# Patient Record
Sex: Male | Born: 1960 | Race: Black or African American | Hispanic: No | Marital: Single | Smoking: Never smoker
Health system: Southern US, Community
[De-identification: ages and names within clinical notes are randomized; demographics above are authoritative.]

## PROBLEM LIST (undated history)

## (undated) DIAGNOSIS — I10 Essential (primary) hypertension: Secondary | ICD-10-CM

---

## 2013-05-20 DIAGNOSIS — F329 Major depressive disorder, single episode, unspecified: Secondary | ICD-10-CM | POA: Diagnosis not present

## 2013-05-20 DIAGNOSIS — G8929 Other chronic pain: Secondary | ICD-10-CM | POA: Diagnosis not present

## 2013-05-20 DIAGNOSIS — F17200 Nicotine dependence, unspecified, uncomplicated: Secondary | ICD-10-CM | POA: Diagnosis not present

## 2013-05-20 DIAGNOSIS — I10 Essential (primary) hypertension: Secondary | ICD-10-CM | POA: Diagnosis not present

## 2013-05-20 DIAGNOSIS — F3289 Other specified depressive episodes: Secondary | ICD-10-CM | POA: Diagnosis not present

## 2013-05-20 DIAGNOSIS — M542 Cervicalgia: Secondary | ICD-10-CM | POA: Diagnosis not present

## 2013-05-20 DIAGNOSIS — Z0000 Encounter for general adult medical examination without abnormal findings: Secondary | ICD-10-CM | POA: Diagnosis not present

## 2013-05-20 DIAGNOSIS — IMO0001 Reserved for inherently not codable concepts without codable children: Secondary | ICD-10-CM | POA: Diagnosis not present

## 2013-05-20 DIAGNOSIS — R7989 Other specified abnormal findings of blood chemistry: Secondary | ICD-10-CM | POA: Diagnosis not present

## 2015-04-20 DIAGNOSIS — M542 Cervicalgia: Secondary | ICD-10-CM | POA: Diagnosis not present

## 2015-04-20 DIAGNOSIS — M5032 Other cervical disc degeneration, mid-cervical region, unspecified level: Secondary | ICD-10-CM | POA: Diagnosis not present

## 2015-04-20 DIAGNOSIS — M9901 Segmental and somatic dysfunction of cervical region: Secondary | ICD-10-CM | POA: Diagnosis not present

## 2015-04-20 DIAGNOSIS — M545 Low back pain: Secondary | ICD-10-CM | POA: Diagnosis not present

## 2015-05-08 DIAGNOSIS — R351 Nocturia: Secondary | ICD-10-CM | POA: Diagnosis not present

## 2015-05-08 DIAGNOSIS — N401 Enlarged prostate with lower urinary tract symptoms: Secondary | ICD-10-CM | POA: Diagnosis not present

## 2015-05-19 DIAGNOSIS — D122 Benign neoplasm of ascending colon: Secondary | ICD-10-CM | POA: Diagnosis not present

## 2015-05-19 DIAGNOSIS — D123 Benign neoplasm of transverse colon: Secondary | ICD-10-CM | POA: Diagnosis not present

## 2015-05-19 DIAGNOSIS — D120 Benign neoplasm of cecum: Secondary | ICD-10-CM | POA: Diagnosis not present

## 2015-05-19 DIAGNOSIS — Z1211 Encounter for screening for malignant neoplasm of colon: Secondary | ICD-10-CM | POA: Diagnosis not present

## 2015-06-03 DIAGNOSIS — I10 Essential (primary) hypertension: Secondary | ICD-10-CM | POA: Diagnosis not present

## 2015-06-03 DIAGNOSIS — G8929 Other chronic pain: Secondary | ICD-10-CM | POA: Diagnosis not present

## 2015-06-03 DIAGNOSIS — M542 Cervicalgia: Secondary | ICD-10-CM | POA: Diagnosis not present

## 2015-06-03 DIAGNOSIS — R7303 Prediabetes: Secondary | ICD-10-CM | POA: Diagnosis not present

## 2015-06-03 DIAGNOSIS — F99 Mental disorder, not otherwise specified: Secondary | ICD-10-CM | POA: Diagnosis not present

## 2015-06-03 DIAGNOSIS — R5383 Other fatigue: Secondary | ICD-10-CM | POA: Diagnosis not present

## 2015-06-03 DIAGNOSIS — Z8639 Personal history of other endocrine, nutritional and metabolic disease: Secondary | ICD-10-CM | POA: Diagnosis not present

## 2015-06-03 DIAGNOSIS — Z0000 Encounter for general adult medical examination without abnormal findings: Secondary | ICD-10-CM | POA: Diagnosis not present

## 2015-06-03 DIAGNOSIS — Z79899 Other long term (current) drug therapy: Secondary | ICD-10-CM | POA: Diagnosis not present

## 2015-07-04 ENCOUNTER — Inpatient Hospital Stay (HOSPITAL_BASED_OUTPATIENT_CLINIC_OR_DEPARTMENT_OTHER)
Admission: EM | Admit: 2015-07-04 | Discharge: 2015-07-09 | DRG: 689 | Disposition: A | Discharge status: Home / Self Care | Payer: Medicare Other | Attending: Internal Medicine | Admitting: Internal Medicine

## 2015-07-04 ENCOUNTER — Emergency Department (HOSPITAL_BASED_OUTPATIENT_CLINIC_OR_DEPARTMENT_OTHER): Payer: Medicare Other

## 2015-07-04 ENCOUNTER — Encounter (HOSPITAL_BASED_OUTPATIENT_CLINIC_OR_DEPARTMENT_OTHER): Payer: Self-pay | Admitting: Emergency Medicine

## 2015-07-04 DIAGNOSIS — R112 Nausea with vomiting, unspecified: Secondary | ICD-10-CM | POA: Diagnosis not present

## 2015-07-04 DIAGNOSIS — K7200 Acute and subacute hepatic failure without coma: Secondary | ICD-10-CM | POA: Diagnosis not present

## 2015-07-04 DIAGNOSIS — N280 Ischemia and infarction of kidney: Secondary | ICD-10-CM | POA: Diagnosis not present

## 2015-07-04 DIAGNOSIS — E861 Hypovolemia: Secondary | ICD-10-CM | POA: Diagnosis present

## 2015-07-04 DIAGNOSIS — N12 Tubulo-interstitial nephritis, not specified as acute or chronic: Secondary | ICD-10-CM | POA: Diagnosis present

## 2015-07-04 DIAGNOSIS — I130 Hypertensive heart and chronic kidney disease with heart failure and stage 1 through stage 4 chronic kidney disease, or unspecified chronic kidney disease: Secondary | ICD-10-CM | POA: Diagnosis present

## 2015-07-04 DIAGNOSIS — I10 Essential (primary) hypertension: Secondary | ICD-10-CM | POA: Diagnosis present

## 2015-07-04 DIAGNOSIS — I5032 Chronic diastolic (congestive) heart failure: Secondary | ICD-10-CM | POA: Diagnosis present

## 2015-07-04 DIAGNOSIS — R1031 Right lower quadrant pain: Secondary | ICD-10-CM | POA: Diagnosis not present

## 2015-07-04 DIAGNOSIS — N179 Acute kidney failure, unspecified: Secondary | ICD-10-CM | POA: Diagnosis not present

## 2015-07-04 DIAGNOSIS — R7401 Elevation of levels of liver transaminase levels: Secondary | ICD-10-CM | POA: Diagnosis present

## 2015-07-04 DIAGNOSIS — R109 Unspecified abdominal pain: Secondary | ICD-10-CM | POA: Diagnosis not present

## 2015-07-04 DIAGNOSIS — N10 Acute tubulo-interstitial nephritis: Secondary | ICD-10-CM | POA: Diagnosis not present

## 2015-07-04 DIAGNOSIS — R740 Nonspecific elevation of levels of transaminase and lactic acid dehydrogenase [LDH]: Secondary | ICD-10-CM

## 2015-07-04 DIAGNOSIS — R1032 Left lower quadrant pain: Secondary | ICD-10-CM | POA: Diagnosis not present

## 2015-07-04 DIAGNOSIS — D72829 Elevated white blood cell count, unspecified: Secondary | ICD-10-CM | POA: Diagnosis present

## 2015-07-04 DIAGNOSIS — E860 Dehydration: Secondary | ICD-10-CM | POA: Diagnosis not present

## 2015-07-04 DIAGNOSIS — N182 Chronic kidney disease, stage 2 (mild): Secondary | ICD-10-CM | POA: Diagnosis present

## 2015-07-04 HISTORY — DX: Essential (primary) hypertension: I10

## 2015-07-04 LAB — I-STAT CG4 LACTIC ACID, ED: LACTIC ACID, VENOUS: 1.89 mmol/L (ref 0.5–2.0)

## 2015-07-04 MED ORDER — ONDANSETRON HCL 4 MG/2ML IJ SOLN
4.0000 mg | Freq: Once | INTRAMUSCULAR | Status: AC
  Administered 2015-07-04: 4 mg via INTRAVENOUS
  Filled 2015-07-04: qty 2

## 2015-07-04 MED ORDER — SODIUM CHLORIDE 0.9 % IV BOLUS (SEPSIS)
1000.0000 mL | Freq: Once | INTRAVENOUS | Status: AC
  Administered 2015-07-04: 1000 mL via INTRAVENOUS

## 2015-07-04 NOTE — ED Provider Notes (Signed)
CSN: [id number]     Arrival date & time 07/04/15  2109 History  By signing my name below, I, Altamease Oiler, attest that this documentation has been prepared under the direction and in the presence of Harvel Quale, MD. Electronically Signed: Altamease Oiler, ED Scribe. 07/05/2015. 12:48 AM    Chief Complaint  Patient presents with  . Generalized Body Aches  . Emesis    The history is provided by the patient. No language interpreter was used.   Brad Robinson is a 55 y.o. male with history of HTN who presents to the Emergency Department complaining of cramping, 7/10 in severity, lower abdominal pain with onset 3 days ago after eating chicken from [facility] "that didn't taste too well". Associated symptoms include chills, sweating, nausea, vomiting, and myalgias. Pt denies diarrhea. No history of diverticulitis. His lat colonoscopy was on the fall of last year. NKDA.   Past Medical History  Diagnosis Date  . Hypertension    History reviewed. No pertinent past surgical history. History reviewed. No pertinent family history. Social History  Substance Use Topics  . Smoking status: Never Smoker   . Smokeless tobacco: None  . Alcohol Use: Yes    Review of Systems  Constitutional: Positive for chills and diaphoresis.  Gastrointestinal: Positive for nausea, vomiting and abdominal pain. Negative for diarrhea.  Musculoskeletal: Positive for myalgias.  All other systems reviewed and are negative.     Allergies  Review of patient's allergies indicates no known allergies.  Home Medications   Prior to Admission medications   Not on File   BP 132/102 mmHg  Pulse 80  Temp(Src) 98.3 F (36.8 C) (Oral)  Resp 16  Ht 5\' 8"  (1.727 m)  Wt 200 lb (90.719 kg)  BMI 30.42 kg/m2  SpO2 100% Physical Exam  Constitutional: He is oriented to person, place, and time. He appears well-developed and well-nourished.  HENT:  Head: Normocephalic and atraumatic.  Mouth/Throat: Mucous membranes  are dry.  Eyes: EOM are normal.  Neck: Normal range of motion.  Cardiovascular: Normal rate, regular rhythm, normal heart sounds and intact distal pulses.   Pulmonary/Chest: Effort normal and breath sounds normal. No respiratory distress.  Abdominal: Soft. He exhibits no distension and no mass. There is tenderness.  Diffuse mild tenderness that is most significant in RLQ and LLQ   Musculoskeletal: Normal range of motion.  Neurological: He is alert and oriented to person, place, and time.  Skin: Skin is warm and dry.  Psychiatric: He has a normal mood and affect. Judgment normal.  Nursing note and vitals reviewed.   ED Course  Procedures (including critical care time) DIAGNOSTIC STUDIES: Oxygen Saturation is 100% on RA,  normal by my interpretation.    COORDINATION OF CARE: 10:46 PM Discussed treatment plan which includes lab work, CT A/P with contrast, and IVF with pt at bedside and pt agreed to plan.  Labs Review Labs Reviewed  CBC WITH DIFFERENTIAL/PLATELET - Abnormal; Notable for the following:    WBC 15.4 (*)    Neutro Abs 12.3 (*)    Monocytes Absolute 1.4 (*)    All other components within normal limits  COMPREHENSIVE METABOLIC PANEL - Abnormal; Notable for the following:    Sodium 132 (*)    Chloride 98 (*)    Glucose, Bld 115 (*)    Creatinine, Ser 1.63 (*)    Calcium 8.7 (*)    AST 88 (*)    ALT 114 (*)    GFR calc non Af Wyvonnia Lora  47 (*)    GFR calc Af Amer 54 (*)    All other components within normal limits  LIPASE, BLOOD  URINALYSIS, ROUTINE W REFLEX MICROSCOPIC (NOT AT [facility])  I-STAT CG4 LACTIC ACID, ED    Imaging Review No results found. I have personally reviewed and evaluated these images and lab results as part of my medical decision-making.   EKG Interpretation None      MDM  Patient seen and evaluated in stable condition. Patient obviously uncomfortable. Labs revealed a leukocytosis as well as a mildly elevated creatinine. Patient was given 2 IV  fluid boluses and Zofran with great improvement in his symptoms. He reported feeling well enough for discharge if CT unremarkable.  Case signed out to night team pending CT results. Final diagnoses:  None    1. Acute vomiting  2. Abdominal pain  I personally performed the services described in this documentation, which was scribed in my presence. The recorded information has been reviewed and is accurate.   Harvel Quale, MD 07/05/15 [phone]

## 2015-07-04 NOTE — ED Notes (Signed)
Pt in c/o abd pain, back pain, muscle spasms, diaphoresis, n/v onset 2 days ago. Thinks he has food poisoning, unknown if fever PTA.

## 2015-07-05 ENCOUNTER — Encounter (HOSPITAL_COMMUNITY): Payer: Self-pay | Admitting: *Deleted

## 2015-07-05 DIAGNOSIS — K7200 Acute and subacute hepatic failure without coma: Secondary | ICD-10-CM | POA: Diagnosis present

## 2015-07-05 DIAGNOSIS — N12 Tubulo-interstitial nephritis, not specified as acute or chronic: Secondary | ICD-10-CM | POA: Diagnosis not present

## 2015-07-05 DIAGNOSIS — R7401 Elevation of levels of liver transaminase levels: Secondary | ICD-10-CM | POA: Diagnosis present

## 2015-07-05 DIAGNOSIS — I10 Essential (primary) hypertension: Secondary | ICD-10-CM | POA: Diagnosis present

## 2015-07-05 DIAGNOSIS — E860 Dehydration: Secondary | ICD-10-CM | POA: Diagnosis present

## 2015-07-05 DIAGNOSIS — N280 Ischemia and infarction of kidney: Secondary | ICD-10-CM

## 2015-07-05 DIAGNOSIS — R109 Unspecified abdominal pain: Secondary | ICD-10-CM | POA: Diagnosis not present

## 2015-07-05 DIAGNOSIS — N10 Acute tubulo-interstitial nephritis: Secondary | ICD-10-CM | POA: Diagnosis present

## 2015-07-05 DIAGNOSIS — N179 Acute kidney failure, unspecified: Secondary | ICD-10-CM | POA: Diagnosis not present

## 2015-07-05 DIAGNOSIS — R740 Nonspecific elevation of levels of transaminase and lactic acid dehydrogenase [LDH]: Secondary | ICD-10-CM

## 2015-07-05 DIAGNOSIS — I5032 Chronic diastolic (congestive) heart failure: Secondary | ICD-10-CM | POA: Diagnosis present

## 2015-07-05 DIAGNOSIS — D72829 Elevated white blood cell count, unspecified: Secondary | ICD-10-CM | POA: Diagnosis not present

## 2015-07-05 DIAGNOSIS — N182 Chronic kidney disease, stage 2 (mild): Secondary | ICD-10-CM | POA: Diagnosis present

## 2015-07-05 DIAGNOSIS — E861 Hypovolemia: Secondary | ICD-10-CM | POA: Diagnosis present

## 2015-07-05 DIAGNOSIS — R935 Abnormal findings on diagnostic imaging of other abdominal regions, including retroperitoneum: Secondary | ICD-10-CM | POA: Diagnosis not present

## 2015-07-05 DIAGNOSIS — I130 Hypertensive heart and chronic kidney disease with heart failure and stage 1 through stage 4 chronic kidney disease, or unspecified chronic kidney disease: Secondary | ICD-10-CM | POA: Diagnosis present

## 2015-07-05 LAB — COMPREHENSIVE METABOLIC PANEL
ALBUMIN: 3.8 g/dL (ref 3.5–5.0)
ALT: 114 U/L — AB (ref 17–63)
ANION GAP: 9 (ref 5–15)
AST: 88 U/L — AB (ref 15–41)
Alkaline Phosphatase: 78 U/L (ref 38–126)
BILIRUBIN TOTAL: 0.8 mg/dL (ref 0.3–1.2)
BUN: 19 mg/dL (ref 6–20)
CALCIUM: 8.7 mg/dL — AB (ref 8.9–10.3)
CHLORIDE: 98 mmol/L — AB (ref 101–111)
CO2: 25 mmol/L (ref 22–32)
Creatinine, Ser: 1.63 mg/dL — ABNORMAL HIGH (ref 0.61–1.24)
GFR calc Af Amer: 54 mL/min — ABNORMAL LOW (ref >60)
GFR, EST NON AFRICAN AMERICAN: 47 mL/min — AB (ref >60)
Glucose, Bld: 115 mg/dL — ABNORMAL HIGH (ref 65–99)
Potassium: 3.6 mmol/L (ref 3.5–5.1)
Sodium: 132 mmol/L — ABNORMAL LOW (ref 135–145)
Total Protein: 7.9 g/dL (ref 6.5–8.1)

## 2015-07-05 LAB — BASIC METABOLIC PANEL
ANION GAP: 13 (ref 5–15)
BUN: 12 mg/dL (ref 6–20)
CALCIUM: 8.5 mg/dL — AB (ref 8.9–10.3)
CO2: 25 mmol/L (ref 22–32)
Chloride: 96 mmol/L — ABNORMAL LOW (ref 101–111)
Creatinine, Ser: 1.62 mg/dL — ABNORMAL HIGH (ref 0.61–1.24)
GFR calc Af Amer: 54 mL/min — ABNORMAL LOW (ref >60)
GFR calc non Af Amer: 47 mL/min — ABNORMAL LOW (ref >60)
GLUCOSE: 110 mg/dL — AB (ref 65–99)
Potassium: 3.4 mmol/L — ABNORMAL LOW (ref 3.5–5.1)
Sodium: 134 mmol/L — ABNORMAL LOW (ref 135–145)

## 2015-07-05 LAB — URINALYSIS, ROUTINE W REFLEX MICROSCOPIC
BILIRUBIN URINE: NEGATIVE
Glucose, UA: NEGATIVE mg/dL
Hgb urine dipstick: NEGATIVE
Ketones, ur: 15 mg/dL — AB
Leukocytes, UA: NEGATIVE
NITRITE: NEGATIVE
PROTEIN: 30 mg/dL — AB
SPECIFIC GRAVITY, URINE: 1.024 (ref 1.005–1.030)
pH: 5.5 (ref 5.0–8.0)

## 2015-07-05 LAB — CBC
HEMATOCRIT: 40.3 % (ref 39.0–52.0)
Hemoglobin: 13.7 g/dL (ref 13.0–17.0)
MCH: 29.5 pg (ref 26.0–34.0)
MCHC: 34 g/dL (ref 30.0–36.0)
MCV: 86.7 fL (ref 78.0–100.0)
Platelets: 327 10*3/uL (ref 150–400)
RBC: 4.65 MIL/uL (ref 4.22–5.81)
RDW: 12.9 % (ref 11.5–15.5)
WBC: 13.4 10*3/uL — AB (ref 4.0–10.5)

## 2015-07-05 LAB — URINE MICROSCOPIC-ADD ON: Bacteria, UA: NONE SEEN

## 2015-07-05 LAB — RAPID URINE DRUG SCREEN, HOSP PERFORMED
AMPHETAMINES: NOT DETECTED
BARBITURATES: NOT DETECTED
BENZODIAZEPINES: NOT DETECTED
COCAINE: NOT DETECTED
Opiates: NOT DETECTED
TETRAHYDROCANNABINOL: POSITIVE — AB

## 2015-07-05 LAB — PROTIME-INR
INR: 1.22 (ref 0.00–1.49)
PROTHROMBIN TIME: 15.6 s — AB (ref 11.6–15.2)

## 2015-07-05 LAB — LACTIC ACID, PLASMA
Lactic Acid, Venous: 1 mmol/L (ref 0.5–2.0)
Lactic Acid, Venous: 1.4 mmol/L (ref 0.5–2.0)

## 2015-07-05 LAB — CBC WITH DIFFERENTIAL/PLATELET
BASOS ABS: 0.1 10*3/uL (ref 0.0–0.1)
BASOS PCT: 0 %
Eosinophils Absolute: 0 10*3/uL (ref 0.0–0.7)
Eosinophils Relative: 0 %
HEMATOCRIT: 41.4 % (ref 39.0–52.0)
HEMOGLOBIN: 14.4 g/dL (ref 13.0–17.0)
LYMPHS PCT: 11 %
Lymphs Abs: 1.7 10*3/uL (ref 0.7–4.0)
MCH: 30 pg (ref 26.0–34.0)
MCHC: 34.8 g/dL (ref 30.0–36.0)
MCV: 86.3 fL (ref 78.0–100.0)
MONO ABS: 1.4 10*3/uL — AB (ref 0.1–1.0)
Monocytes Relative: 9 %
NEUTROS ABS: 12.3 10*3/uL — AB (ref 1.7–7.7)
NEUTROS PCT: 80 %
Platelets: 323 10*3/uL (ref 150–400)
RBC: 4.8 MIL/uL (ref 4.22–5.81)
RDW: 12.7 % (ref 11.5–15.5)
WBC: 15.4 10*3/uL — AB (ref 4.0–10.5)

## 2015-07-05 LAB — MAGNESIUM: Magnesium: 2 mg/dL (ref 1.7–2.4)

## 2015-07-05 LAB — LIPASE, BLOOD: LIPASE: 29 U/L (ref 11–51)

## 2015-07-05 LAB — CREATININE, URINE, RANDOM: Creatinine, Urine: 69.32 mg/dL

## 2015-07-05 LAB — TSH: TSH: 1.222 u[IU]/mL (ref 0.350–4.500)

## 2015-07-05 LAB — SODIUM, URINE, RANDOM: Sodium, Ur: 108 mmol/L

## 2015-07-05 MED ORDER — DEXTROSE 5 % IV SOLN
2.0000 g | INTRAVENOUS | Status: DC
  Administered 2015-07-05 – 2015-07-08 (×4): 2 g via INTRAVENOUS
  Filled 2015-07-05 (×5): qty 2

## 2015-07-05 MED ORDER — ONDANSETRON HCL 4 MG PO TABS: 4.0000 mg | ORAL_TABLET | Freq: Three times a day (TID) | ORAL | Status: AC | PRN

## 2015-07-05 MED ORDER — METHOCARBAMOL 500 MG PO TABS
750.0000 mg | ORAL_TABLET | Freq: Four times a day (QID) | ORAL | Status: DC | PRN
  Administered 2015-07-06 – 2015-07-07 (×3): 750 mg via ORAL
  Filled 2015-07-05 (×3): qty 2

## 2015-07-05 MED ORDER — SODIUM CHLORIDE 0.9 % IV BOLUS (SEPSIS)
1000.0000 mL | Freq: Once | INTRAVENOUS | Status: AC
  Administered 2015-07-05: 1000 mL via INTRAVENOUS

## 2015-07-05 MED ORDER — WHITE PETROLATUM GEL
Status: AC
  Administered 2015-07-05: 0.2
  Filled 2015-07-05: qty 1

## 2015-07-05 MED ORDER — ENOXAPARIN SODIUM 40 MG/0.4ML ~~LOC~~ SOLN
40.0000 mg | SUBCUTANEOUS | Status: DC
  Administered 2015-07-05: 40 mg via SUBCUTANEOUS
  Filled 2015-07-05: qty 0.4

## 2015-07-05 MED ORDER — IOPAMIDOL (ISOVUE-300) INJECTION 61%
100.0000 mL | Freq: Once | INTRAVENOUS | Status: AC | PRN
  Administered 2015-07-05: 100 mL via INTRAVENOUS

## 2015-07-05 MED ORDER — SODIUM CHLORIDE 0.45 % IV BOLUS: 1000.0000 mL | Freq: Once | INTRAVENOUS | Status: DC

## 2015-07-05 MED ORDER — ALBUTEROL SULFATE (2.5 MG/3ML) 0.083% IN NEBU: 2.5000 mg | INHALATION_SOLUTION | RESPIRATORY_TRACT | Status: DC | PRN

## 2015-07-05 MED ORDER — CEFTRIAXONE SODIUM 1 G IJ SOLR
1.0000 g | Freq: Once | INTRAMUSCULAR | Status: AC
  Administered 2015-07-05: 1 g via INTRAVENOUS
  Filled 2015-07-05: qty 10

## 2015-07-05 MED ORDER — HYDRALAZINE HCL 20 MG/ML IJ SOLN: 10.0000 mg | Freq: Four times a day (QID) | INTRAMUSCULAR | Status: DC | PRN

## 2015-07-05 MED ORDER — MORPHINE SULFATE (PF) 2 MG/ML IV SOLN
2.0000 mg | INTRAVENOUS | Status: DC | PRN
Start: 2015-07-05 — End: 2015-07-09

## 2015-07-05 MED ORDER — SODIUM CHLORIDE 0.9 % IV SOLN
INTRAVENOUS | Status: DC
  Administered 2015-07-05 – 2015-07-08 (×8): via INTRAVENOUS

## 2015-07-05 MED ORDER — ONDANSETRON HCL 4 MG/2ML IJ SOLN
4.0000 mg | Freq: Once | INTRAMUSCULAR | Status: AC
  Administered 2015-07-05: 4 mg via INTRAVENOUS
  Filled 2015-07-05: qty 2

## 2015-07-05 MED ORDER — ACETAMINOPHEN 650 MG RE SUPP: 650.0000 mg | Freq: Four times a day (QID) | RECTAL | Status: DC | PRN

## 2015-07-05 MED ORDER — HYDROCODONE-ACETAMINOPHEN 5-325 MG PO TABS: 1.0000 | ORAL_TABLET | ORAL | Status: DC | PRN

## 2015-07-05 MED ORDER — FENTANYL CITRATE (PF) 100 MCG/2ML IJ SOLN
50.0000 ug | Freq: Once | INTRAMUSCULAR | Status: AC
  Administered 2015-07-05: 50 ug via INTRAVENOUS
  Filled 2015-07-05: qty 2

## 2015-07-05 MED ORDER — POTASSIUM CHLORIDE 10 MEQ/100ML IV SOLN
INTRAVENOUS | Status: AC
  Filled 2015-07-05: qty 100

## 2015-07-05 MED ORDER — ONDANSETRON HCL 4 MG/2ML IJ SOLN: 4.0000 mg | Freq: Four times a day (QID) | INTRAMUSCULAR | Status: DC | PRN

## 2015-07-05 MED ORDER — ONDANSETRON HCL 4 MG PO TABS
4.0000 mg | ORAL_TABLET | Freq: Four times a day (QID) | ORAL | Status: DC | PRN
  Administered 2015-07-06: 4 mg via ORAL
  Filled 2015-07-05: qty 1

## 2015-07-05 MED ORDER — POTASSIUM CHLORIDE 10 MEQ/100ML IV SOLN
10.0000 meq | INTRAVENOUS | Status: AC
  Administered 2015-07-05 (×4): 10 meq via INTRAVENOUS
  Filled 2015-07-05: qty 100

## 2015-07-05 MED ORDER — ACETAMINOPHEN 325 MG PO TABS: 650.0000 mg | ORAL_TABLET | Freq: Four times a day (QID) | ORAL | Status: DC | PRN

## 2015-07-05 NOTE — ED Notes (Addendum)
Resting/ sleeping, NAD, calm, arousable to voice, "feel a little better".

## 2015-07-05 NOTE — Progress Notes (Signed)
Pharmacy Antibiotic Note  Ken Cambray is a 55 y.o. male admitted on 07/04/2015 with Pyelonehritis.  Pharmacy has been consulted for Ceftriaxone dosing. WBC elevated, Imaging with bilateral pyelo vs renal infarcts.   Plan: -Ceftriaxone 2g IV q24h -F/U urine culture for directed therapy  Height: 5\' 8"  (172.7 cm) Weight: 182 lb 5.1 oz (82.7 kg) IBW/kg (Calculated) : 68.4  Temp (24hrs), Avg:98.7 F (37.1 C), Min:98.3 F (36.8 C), Max:98.9 F (37.2 C)   Recent Labs Lab 07/04/15 2323 07/04/15 2359  WBC  --  15.4*  CREATININE  --  1.63*  LATICACIDVEN 1.89  --     Estimated Creatinine Clearance: 54.9 mL/min (by C-G formula based on Cr of 1.63).    No Known Allergies   Redrick, Nordahl 07/05/2015 7:01 AM

## 2015-07-05 NOTE — Progress Notes (Addendum)
55 yo M previously healthy presents with myalgias, flank pain, nausea/vomiting, sweats.    BP 146/94 mmHg  Pulse 74  Temp(Src) 98.3 F (36.8 C) (Oral)  Resp 16  Ht 5\' 8"  (1.727 m)  Wt 90.719 kg (200 lb)  BMI 30.42 kg/m2  SpO2 95%  UA without bacteria, pyuria.  But CT shows wedge shaped lesions on CT w/ contrast consistent with either pyelo or infarction.    Creatinine 1.6, baseline 1.1.  Will obtain urine lytes, blood cultures, urine culture, start ceftriaxone.  To med surg bed on fluids.

## 2015-07-05 NOTE — ED Notes (Signed)
Pt in CT.

## 2015-07-05 NOTE — H&P (Signed)
[facility] History and Physical  Brad Robinson [id number] DOB: 02-17-1961 DOA: 07/04/2015  Referring physician: [facility] ED PCP: No primary care provider on file.   Chief Complaint: Abdominal cramps/spasm and vomiting  HPI:  Brad Robinson is a 55 year old male with a past medical history significant for hypertension who presents complaining of abdominal cramping/spasms. Symptoms started 3 days ago after eating chicken from [place]. Thereafter reports having hot flashes, chills, sweats, nausea, vomiting, and whole-body muscle spasms. Reports being not able to keep any food or liquids down. Reports significant pains in the lower abdomen that radiates upward. Denies any chest pain, focal weakness, changes in vision, or rash. Patient never had symptoms like this previously in the past.   Upon Admission to the emergency department patient was evaluated with CT scan of abdomen which revealed multiple wedge-shaped lesions suggestive of pyelonephritis versus renal infarctions. Urine analysis was negative. Patient was started on IV fluids, Rocephin,  and transferred from [facility] [place] to a MedSurg bed for the hospitalist group to dependent.    Review of Systems  Constitutional: Positive for fever, chills, malaise/fatigue and diaphoresis.  HENT: Negative for ear pain and tinnitus.   Respiratory: Negative for hemoptysis and shortness of breath.   Cardiovascular: Negative for chest pain.  Gastrointestinal: Positive for nausea, vomiting and abdominal pain. Negative for diarrhea.  Genitourinary: Positive for flank pain. Negative for urgency.  Musculoskeletal: Positive for myalgias and back pain.  Skin: Negative for itching and rash.  Neurological: Negative for speech change and focal weakness.  Psychiatric/Behavioral: Negative for hallucinations and substance abuse.     Past Medical History  Diagnosis Date  . Hypertension      History reviewed. No pertinent past surgical  history.    Social History:  reports that he has never smoked. He does not have any smokeless tobacco history on file. He reports that he drinks alcohol. His drug history is not on file. Where does patient live--home Can patient participate in ADLs? Yes  No Known Allergies  History reviewed. No pertinent family history.    Prior to Admission medications   Medication Sig Start Date End Date Taking? Authorizing Provider  ondansetron (ZOFRAN) 4 MG tablet Take 1 tablet (4 mg total) by mouth every 8 (eight) hours as needed for nausea or vomiting. 07/05/15   Harvel Quale, MD     Physical Exam: Filed Vitals:   07/05/15 0330 07/05/15 0340 07/05/15 0345 07/05/15 0350  BP: 145/103 145/103 149/96   Pulse: 81 76 79   Temp:    98.8 F (37.1 C)  TempSrc:    Oral  Resp:  16    Height:      Weight:      SpO2: 97% 97% 97%     Constitutional: Vital signs reviewed. Patient is sick appearing , but nontoxic. Alert and oriented 3. Head: Normocephalic and atraumatic  Ear: TM normal bilaterally  Mouth: no erythema or exudates, MMM  Eyes: PERRL, EOMI, conjunctivae normal, No scleral icterus.  Neck: Supple, Trachea midline normal ROM, No JVD, mass, thyromegaly, or carotid bruit present.  Cardiovascular: RRR, S1 normal, S2 normal, no MRG, pulses symmetric and intact bilaterally  Pulmonary/Chest: CTAB, no wheezes, rales, or rhonchi  Abdominal: Soft. Diffusely tender in the lower quadrants, positive bowel sounds, no guarding noted [id number] bilateral CVA tenderness  Musculoskeletal: No joint deformities, erythema, or stiffness, ROM full and no nontender Ext: no edema and no cyanosis, pulses palpable bilaterally (DP and PT)  Hematology: no cervical, inginal, or  axillary adenopathy.  Neurological: A&O x3, Strenght is normal and symmetric bilaterally, cranial nerve II-XII are grossly intact, no focal motor deficit, sensory intact to light touch bilaterally.  Skin: Warm, dry and intact. No rash,  cyanosis, or clubbing.  Psychiatric: Normal mood and affect. speech and behavior is normal. Judgment and thought content normal. Cognition and memory are normal.      Data Review   Micro Results No results found for this or any previous visit (from the past 240 hour(s)).  Radiology Reports Ct Abdomen Pelvis W Contrast  07/05/2015  CLINICAL DATA:  Cramping abdominal pain, onset 3 days ago. EXAM: CT ABDOMEN AND PELVIS WITH CONTRAST TECHNIQUE: Multidetector CT imaging of the abdomen and pelvis was performed using the standard protocol following bolus administration of intravenous contrast. CONTRAST:  114mL ISOVUE-300 IOPAMIDOL (ISOVUE-300) INJECTION 61% COMPARISON:  None. FINDINGS: Lower chest:  No significant abnormality Hepatobiliary: There are normal appearances of the liver, gallbladder and bile ducts. Pancreas: Normal Spleen: Normal Adrenals/Urinary Tract: Both adrenals are normal. There are multiple wedge-shaped low-attenuation abnormalities in both kidneys, involving cortex and medulla. This may represent pyelonephritis. Renal infarctions could also produce this appearance. No discrete renal mass is evident. No obstructive uropathy. The ureters and urinary bladder are unremarkable. Stomach/Bowel: There are normal appearances of the stomach, small bowel and colon. The appendix is normal. Vascular/Lymphatic: The abdominal aorta is normal in caliber. There is mild atherosclerotic calcification. There is no adenopathy in the abdomen or pelvis. Reproductive: Unremarkable Other: No ascites. Musculoskeletal: No significant skeletal lesion. Moderately severe degenerative lumbar disc disease is present at L4 through the sacrum. IMPRESSION: Both kidneys are abnormal, with multiple wedge-shaped peripherally based areas of hypodensity. This may represent bilateral pyelonephritis. Renal infarctions may be considered as a less likely possibility. Electronically Signed   By: Andreas Newport M.D.   On: 07/05/2015  02:13     CBC  Recent Labs Lab 07/04/15 2359  WBC 15.4*  HGB 14.4  HCT 41.4  PLT 323  MCV 86.3  MCH 30.0  MCHC 34.8  RDW 12.7  LYMPHSABS 1.7  MONOABS 1.4*  EOSABS 0.0  BASOSABS 0.1    Chemistries   Recent Labs Lab 07/04/15 2359  NA 132*  K 3.6  CL 98*  CO2 25  GLUCOSE 115*  BUN 19  CREATININE 1.63*  CALCIUM 8.7*  AST 88*  ALT 114*  ALKPHOS 78  BILITOT 0.8   ------------------------------------------------------------------------------------------------------------------ estimated creatinine clearance is 57.3 mL/min (by C-G formula based on Cr of 1.63). ------------------------------------------------------------------------------------------------------------------ No results for input(s): HGBA1C in the last 72 hours. ------------------------------------------------------------------------------------------------------------------ No results for input(s): CHOL, HDL, LDLCALC, TRIG, CHOLHDL, LDLDIRECT in the last 72 hours. ------------------------------------------------------------------------------------------------------------------ No results for input(s): TSH, T4TOTAL, T3FREE, THYROIDAB in the last 72 hours.  Invalid input(s): FREET3 ------------------------------------------------------------------------------------------------------------------ No results for input(s): VITAMINB12, FOLATE, FERRITIN, TIBC, IRON, RETICCTPCT in the last 72 hours.  Coagulation profile No results for input(s): INR, PROTIME in the last 168 hours.  No results for input(s): DDIMER in the last 72 hours.  Cardiac Enzymes No results for input(s): CKMB, TROPONINI, MYOGLOBIN in the last 168 hours.  Invalid input(s): CK ------------------------------------------------------------------------------------------------------------------ Invalid input(s): POCBNP   CBG: No results for input(s): GLUCAP in the last 168 hours.        Assessment/Plan Question of Pyelonephritis  vs. Renal infarct:. CT reporting the possibility of bilateral pyelonephritis versus bilateral renal infarcts. UA appeared to be negative. - Admit to a MedSurg bed - Checking urine drug screen - Follow up blood cultures -  Placed on empiric antibiotics of Rocephin   Leukocytosis: WBC 15.4 - Checking lactic acid now  Acute kidney injury: Baseline creatinine somewhere around 1.1, but on admission elevated to 1.63. Cause unknown with suspected aspect of prerenal. - IVF of NS at 180ml/hr - Checking FeNa  Essential hypertension: Stable - Continue to monitor   Transaminitis: Secondary to dehydration - Continue to monitor    Lovenox for DVT prophylaxis  Code Status:   full Family Communication: bedside Disposition Plan: admit   Total time spent 55 minutes.Greater than 50% of this time was spent in counseling, explanation of diagnosis, planning of further management, and coordination of care  [facility] Pager [phone]  If 7PM-7AM, please contact night-coverage [email or web address] Password [email or web address] 07/05/2015, 6:01 AM

## 2015-07-05 NOTE — Progress Notes (Signed)
Patient seen and examined, does not look comfortable, though reports feeling better, no vomiting, back pain and abdominal better, pyelonephritis vs bilateral renal infarct, on abx, culture pending, check echo to r/o embolic source

## 2015-07-06 ENCOUNTER — Inpatient Hospital Stay (HOSPITAL_COMMUNITY): Payer: Medicare Other

## 2015-07-06 DIAGNOSIS — N280 Ischemia and infarction of kidney: Secondary | ICD-10-CM

## 2015-07-06 LAB — CBC
HCT: 36.7 % — ABNORMAL LOW (ref 39.0–52.0)
Hemoglobin: 12.4 g/dL — ABNORMAL LOW (ref 13.0–17.0)
MCH: 29.2 pg (ref 26.0–34.0)
MCHC: 33.8 g/dL (ref 30.0–36.0)
MCV: 86.4 fL (ref 78.0–100.0)
PLATELETS: 315 10*3/uL (ref 150–400)
RBC: 4.25 MIL/uL (ref 4.22–5.81)
RDW: 12.8 % (ref 11.5–15.5)
WBC: 14 10*3/uL — ABNORMAL HIGH (ref 4.0–10.5)

## 2015-07-06 LAB — HEPATITIS PANEL, ACUTE
HCV Ab: <0.1 s/co ratio (ref 0.0–0.9)
HEP B C IGM: NEGATIVE
HEP B S AG: NEGATIVE
Hep A IgM: NEGATIVE

## 2015-07-06 LAB — COMPREHENSIVE METABOLIC PANEL
ALT: 138 U/L — AB (ref 17–63)
AST: 85 U/L — AB (ref 15–41)
Albumin: 2.6 g/dL — ABNORMAL LOW (ref 3.5–5.0)
Alkaline Phosphatase: 80 U/L (ref 38–126)
Anion gap: 9 (ref 5–15)
BUN: 10 mg/dL (ref 6–20)
CHLORIDE: 100 mmol/L — AB (ref 101–111)
CO2: 25 mmol/L (ref 22–32)
CREATININE: 1.41 mg/dL — AB (ref 0.61–1.24)
Calcium: 8.3 mg/dL — ABNORMAL LOW (ref 8.9–10.3)
GFR calc non Af Amer: 55 mL/min — ABNORMAL LOW (ref >60)
Glucose, Bld: 98 mg/dL (ref 65–99)
Potassium: 3.8 mmol/L (ref 3.5–5.1)
SODIUM: 134 mmol/L — AB (ref 135–145)
Total Bilirubin: 0.9 mg/dL (ref 0.3–1.2)
Total Protein: 6.2 g/dL — ABNORMAL LOW (ref 6.5–8.1)

## 2015-07-06 LAB — HEPARIN LEVEL (UNFRACTIONATED): Heparin Unfractionated: 0.32 IU/mL (ref 0.30–0.70)

## 2015-07-06 LAB — ANTITHROMBIN III: AntiThromb III Func: 96 % (ref 75–120)

## 2015-07-06 LAB — LACTIC ACID, PLASMA: LACTIC ACID, VENOUS: 0.9 mmol/L (ref 0.5–2.0)

## 2015-07-06 MED ORDER — HEPARIN (PORCINE) IN NACL 100-0.45 UNIT/ML-% IJ SOLN
1600.0000 [IU]/h | INTRAMUSCULAR | Status: AC
  Administered 2015-07-06 (×2): 1150 [IU]/h via INTRAVENOUS
  Filled 2015-07-06 (×3): qty 250

## 2015-07-06 MED ORDER — HEPARIN BOLUS VIA INFUSION
4000.0000 [IU] | Freq: Once | INTRAVENOUS | Status: AC
  Administered 2015-07-06: 4000 [IU] via INTRAVENOUS
  Filled 2015-07-06: qty 4000

## 2015-07-06 NOTE — Progress Notes (Signed)
*  PRELIMINARY RESULTS* Vascular Ultrasound Renal Artery Duplex has been completed.  Preliminary findings: No evidence of renal artery stenosis.   Landry Mellow, RDMS, RVT  07/06/2015, 12:02 PM

## 2015-07-06 NOTE — Progress Notes (Signed)
[facility] PROGRESS NOTE  Brad Robinson [id number] DOB: May 16, 1960 DOA: 07/04/2015   PCP: No primary care provider on file.  Length of stay: 1   Assessment/Plan: Principal Problem:   Pyelonephritis Active Problems:   Renal infarct (HCC)   Leukocytosis   AKI (acute kidney injury) ([place])   Transaminitis   Benign essential HTN   Brief summary 55 year old male with a past medical history significant for hypertension who presents complaining of abdominal cramping/spasms. Symptoms started 3 days ago after eating chicken from [place]. Thereafter reports having hot flashes, chills, sweats, nausea, vomiting, and whole-body muscle spasms. Reports being not able to keep any food or liquids down. Reports significant pains in the lower abdomen that radiates upward. Denies any chest pain, focal weakness, changes in vision, or rash. Patient never had symptoms like this previously in the past.   Upon Admission to the emergency department patient was evaluated with CT scan of abdomen which revealed multiple wedge-shaped lesions suggestive of pyelonephritis versus renal infarctions. Urine analysis was negative. Patient was started on IV fluids, Rocephin, and transferred from [facility] to a MedSurg bed for the hospitalist group to dependent.  Assessment and plan  Question of Pyelonephritis vs. Renal infarct:. CT reporting the possibility of bilateral pyelonephritis versus bilateral renal infarcts. UA appeared to be negative. Place patient on tele to r/o afib  - Follow up blood cultures - Placed on empiric antibiotics of Rocephin  Echo to r/o low EF Hypercoagulable panel R/o Renal artery stenosis , renal artery doppler  Repeat CT scan in 48 hours, hold off on Coumadin for now pending repeat CT scan  Leukocytosis: WBC 15.4 - Checking lactic acid now  Acute kidney injury: Baseline creatinine somewhere around 1.1, but on admission elevated to 1.63>1.41. Cause unknown with  suspected aspect of prerenal. - IVF of NS at 164ml/hr    Essential hypertension: Stable - Continue to monitor  Transaminitis: Secondary to dehydration - Continue to monitor   DVT prophylaxsis heparin gtt   Code Status:      Code Status Orders        Start     Ordered   07/05/15 0557  Full code   Continuous     07/05/15 0559    Code Status History    Date Active Date Inactive Code Status Order ID Comments User Context   This patient has a current code status but no historical code status.      Family Communication: Discussed in detail with the patient, all imaging results, lab results explained to the patient   Disposition Plan: Repeat CT scan in 48 hours    Consultants:  None  Procedures:  None  Antibiotics: Anti-infectives    Start     Dose/Rate Route Frequency Ordered Stop   07/05/15 2200  cefTRIAXone (ROCEPHIN) 2 g in dextrose 5 % 50 mL IVPB     2 g 100 mL/hr over 30 Minutes Intravenous Every 24 hours 07/05/15 0700     07/05/15 0300  cefTRIAXone (ROCEPHIN) 1 g in dextrose 5 % 50 mL IVPB     1 g 100 mL/hr over 30 Minutes Intravenous  Once 07/05/15 0248 07/05/15 0419         HPI/Subjective: Patient had a low-grade fever last night  Objective: Filed Vitals:   07/05/15 1654 07/05/15 2100 07/06/15 0446 07/06/15 1033  BP: 145/83 166/97 161/98 161/96  Pulse: 84 98 84 78  Temp: 98.7 F (37.1 C) 100.1 F (37.8  C) 99.3 F (37.4 C) 99.8 F (37.7 C)  TempSrc: Oral Oral Oral Oral  Resp: 18 18 18 17   Height:      Weight:  83 kg (182 lb 15.7 oz)    SpO2: 96% 100% 100% 100%    Intake/Output Summary (Last 24 hours) at 07/06/15 1101 Last data filed at 07/06/15 1034  Gross per 24 hour  Intake   3410 ml  Output   3850 ml  Net   -440 ml    Exam:  Examination:  General exam: Appears calm and comfortable  Respiratory system: Clear to auscultation. Respiratory effort normal. Cardiovascular system: S1 & S2 heard, RRR. No JVD, murmurs, rubs,  gallops or clicks. No pedal edema. Gastrointestinal system: Abdomen is nondistended, soft and nontender. No organomegaly or masses felt. Normal bowel sounds heard. Central nervous system: Alert and oriented. No focal neurological deficits. Extremities: Symmetric 5 x 5 power. Skin: No rashes, lesions or ulcers Psychiatry: Judgement and insight appear normal. Mood & affect appropriate.     Data Reviewed: I have personally reviewed following labs and imaging studies  Micro Results No results found for this or any previous visit (from the past 240 hour(s)).  Radiology Reports Ct Abdomen Pelvis W Contrast  07/05/2015  CLINICAL DATA:  Cramping abdominal pain, onset 3 days ago. EXAM: CT ABDOMEN AND PELVIS WITH CONTRAST TECHNIQUE: Multidetector CT imaging of the abdomen and pelvis was performed using the standard protocol following bolus administration of intravenous contrast. CONTRAST:  164mL ISOVUE-300 IOPAMIDOL (ISOVUE-300) INJECTION 61% COMPARISON:  None. FINDINGS: Lower chest:  No significant abnormality Hepatobiliary: There are normal appearances of the liver, gallbladder and bile ducts. Pancreas: Normal Spleen: Normal Adrenals/Urinary Tract: Both adrenals are normal. There are multiple wedge-shaped low-attenuation abnormalities in both kidneys, involving cortex and medulla. This may represent pyelonephritis. Renal infarctions could also produce this appearance. No discrete renal mass is evident. No obstructive uropathy. The ureters and urinary bladder are unremarkable. Stomach/Bowel: There are normal appearances of the stomach, small bowel and colon. The appendix is normal. Vascular/Lymphatic: The abdominal aorta is normal in caliber. There is mild atherosclerotic calcification. There is no adenopathy in the abdomen or pelvis. Reproductive: Unremarkable Other: No ascites. Musculoskeletal: No significant skeletal lesion. Moderately severe degenerative lumbar disc disease is present at L4 through the  sacrum. IMPRESSION: Both kidneys are abnormal, with multiple wedge-shaped peripherally based areas of hypodensity. This may represent bilateral pyelonephritis. Renal infarctions may be considered as a less likely possibility. Electronically Signed   By: Andreas Newport M.D.   On: 07/05/2015 02:13     CBC  Recent Labs Lab 07/04/15 2359 07/05/15 1010 07/06/15 0532  WBC 15.4* 13.4* 14.0*  HGB 14.4 13.7 12.4*  HCT 41.4 40.3 36.7*  PLT 323 327 315  MCV 86.3 86.7 86.4  MCH 30.0 29.5 29.2  MCHC 34.8 34.0 33.8  RDW 12.7 12.9 12.8  LYMPHSABS 1.7  --   --   MONOABS 1.4*  --   --   EOSABS 0.0  --   --   BASOSABS 0.1  --   --     Chemistries   Recent Labs Lab 07/04/15 2359 07/05/15 1010 07/06/15 0532  NA 132* 134* 134*  K 3.6 3.4* 3.8  CL 98* 96* 100*  CO2 25 25 25   GLUCOSE 115* 110* 98  BUN 19 12 10   CREATININE 1.63* 1.62* 1.41*  CALCIUM 8.7* 8.5* 8.3*  MG  --  2.0  --   AST 88*  --  85*  ALT 114*  --  138*  ALKPHOS 78  --  80  BILITOT 0.8  --  0.9   ------------------------------------------------------------------------------------------------------------------ estimated creatinine clearance is 63.6 mL/min (by C-G formula based on Cr of 1.41). ------------------------------------------------------------------------------------------------------------------ No results for input(s): HGBA1C in the last 72 hours. ------------------------------------------------------------------------------------------------------------------ No results for input(s): CHOL, HDL, LDLCALC, TRIG, CHOLHDL, LDLDIRECT in the last 72 hours. ------------------------------------------------------------------------------------------------------------------  Recent Labs  07/05/15 1010  TSH 1.222   ------------------------------------------------------------------------------------------------------------------ No results for input(s): VITAMINB12, FOLATE, FERRITIN, TIBC, IRON, RETICCTPCT in the  last 72 hours.  Coagulation profile  Recent Labs Lab 07/05/15 1010  INR 1.22    No results for input(s): DDIMER in the last 72 hours.  Cardiac Enzymes No results for input(s): CKMB, TROPONINI, MYOGLOBIN in the last 168 hours.  Invalid input(s): CK ------------------------------------------------------------------------------------------------------------------ Invalid input(s): POCBNP   CBG: No results for input(s): GLUCAP in the last 168 hours.     Studies: Ct Abdomen Pelvis W Contrast  07/05/2015  CLINICAL DATA:  Cramping abdominal pain, onset 3 days ago. EXAM: CT ABDOMEN AND PELVIS WITH CONTRAST TECHNIQUE: Multidetector CT imaging of the abdomen and pelvis was performed using the standard protocol following bolus administration of intravenous contrast. CONTRAST:  1103mL ISOVUE-300 IOPAMIDOL (ISOVUE-300) INJECTION 61% COMPARISON:  None. FINDINGS: Lower chest:  No significant abnormality Hepatobiliary: There are normal appearances of the liver, gallbladder and bile ducts. Pancreas: Normal Spleen: Normal Adrenals/Urinary Tract: Both adrenals are normal. There are multiple wedge-shaped low-attenuation abnormalities in both kidneys, involving cortex and medulla. This may represent pyelonephritis. Renal infarctions could also produce this appearance. No discrete renal mass is evident. No obstructive uropathy. The ureters and urinary bladder are unremarkable. Stomach/Bowel: There are normal appearances of the stomach, small bowel and colon. The appendix is normal. Vascular/Lymphatic: The abdominal aorta is normal in caliber. There is mild atherosclerotic calcification. There is no adenopathy in the abdomen or pelvis. Reproductive: Unremarkable Other: No ascites. Musculoskeletal: No significant skeletal lesion. Moderately severe degenerative lumbar disc disease is present at L4 through the sacrum. IMPRESSION: Both kidneys are abnormal, with multiple wedge-shaped peripherally based areas of  hypodensity. This may represent bilateral pyelonephritis. Renal infarctions may be considered as a less likely possibility. Electronically Signed   By: Andreas Newport M.D.   On: 07/05/2015 02:13      No results found for: HGBA1C Lab Results  Component Value Date   CREATININE 1.41* 07/06/2015       Scheduled Meds: . cefTRIAXone (ROCEPHIN)  IV  2 g Intravenous Q24H  . heparin  4,000 Units Intravenous Once   Continuous Infusions: . sodium chloride 125 mL/hr at 07/06/15 0448  . heparin      Principal Problem:   Pyelonephritis Active Problems:   Renal infarct (HCC)   Leukocytosis   AKI (acute kidney injury) ([place])   Transaminitis   Benign essential HTN    Time spent: 40 minutes   [facility] Pager [phone]. If 7PM-7AM, please contact night-coverage at [email or web address], password [facility] 07/06/2015, 11:01 AM  LOS: 1 day

## 2015-07-06 NOTE — Progress Notes (Signed)
ANTICOAGULATION CONSULT NOTE - Initial Consult  Pharmacy Consult for heparin Indication: renal infarcts  No Known Allergies  Patient Measurements: Height: 5\' 8"  (172.7 cm) Weight: 182 lb 15.7 oz (83 kg) IBW/kg (Calculated) : 68.4 Heparin Dosing Weight: 83kg  Vital Signs: Temp: 99.3 F (37.4 C) (04/17 0446) Temp Source: Oral (04/17 0446) BP: 161/98 mmHg (04/17 0446) Pulse Rate: 84 (04/17 0446)  Labs:  Recent Labs  07/04/15 2359 07/05/15 1010 07/06/15 0532  HGB 14.4 13.7 12.4*  HCT 41.4 40.3 36.7*  PLT 323 327 315  LABPROT  --  15.6*  --   INR  --  1.22  --   CREATININE 1.63* 1.62* 1.41*    Estimated Creatinine Clearance: 63.6 mL/min (by C-G formula based on Cr of 1.41).   Assessment: Brad Robinson admitted with pyelonephritis vs bilateral renal infarcts. Concern for embolic source. Was on prophylactic Lovenox, now to switch to IV heparin. Last dose of Lovenox was given yesterday morning. Hgb 12.4, plts 315- no bleeding noted.  Goal of Therapy:  Heparin level 0.3-0.7 units/ml Monitor platelets by anticoagulation protocol: Yes   Plan:  -stop Lovenox -start heparin with bolus 4000 units IV x1, then start infusion at 1150 units/hr -heparin level in 6h -daily HL and CBC -follow workup and need for long term anticoagulation  Charonda Hefter D. Jamarria Real, PharmD, BCPS Clinical Pharmacist Pager: [phone] 07/06/2015 9:55 AM

## 2015-07-06 NOTE — Progress Notes (Signed)
ANTICOAGULATION CONSULT NOTE - Initial Consult  Pharmacy Consult for heparin Indication: renal infarcts  No Known Allergies  Patient Measurements: Height: 5\' 8"  (172.7 cm) Weight: 182 lb 15.7 oz (83 kg) IBW/kg (Calculated) : 68.4 Heparin Dosing Weight: 83kg  Vital Signs: Temp: 100.2 F (37.9 C) (04/17 1637) Temp Source: Oral (04/17 1637) BP: 142/92 mmHg (04/17 1637) Pulse Rate: 73 (04/17 1637)  Labs:  Recent Labs  07/04/15 2359 07/05/15 1010 07/06/15 0532 07/06/15 1703  HGB 14.4 13.7 12.4*  --   HCT 41.4 40.3 36.7*  --   PLT 323 327 315  --   LABPROT  --  15.6*  --   --   INR  --  1.22  --   --   HEPARINUNFRC  --   --   --  0.32  CREATININE 1.63* 1.62* 1.41*  --     Estimated Creatinine Clearance: 63.6 mL/min (by C-G formula based on Cr of 1.41).   Assessment: 35 YOM admitted with pyelonephritis vs bilateral renal infarcts. Concern for embolic source. Was on prophylactic Lovenox, now to switch to IV heparin. Last dose of Lovenox was given yesterday morning. Hgb 12.4, plts 315- no bleeding noted. First HL was therapeutic at 0.32.  Goal of Therapy:  Heparin level 0.3-0.7 units/ml Monitor platelets by anticoagulation protocol: Yes   Plan:  Continue heparin gtt at 1,150 units/hr Monitor daily HL, CBC, s/s of bleed Check 6 hr confirmatory HL Follow workup and need for long term anticoagulation  Elenor Quinones, PharmD, BCPS Clinical Pharmacist Pager [phone] 07/06/2015 6:58 PM

## 2015-07-07 ENCOUNTER — Inpatient Hospital Stay (HOSPITAL_COMMUNITY): Payer: Medicare Other

## 2015-07-07 DIAGNOSIS — I10 Essential (primary) hypertension: Secondary | ICD-10-CM

## 2015-07-07 LAB — COMPREHENSIVE METABOLIC PANEL
ALK PHOS: 102 U/L (ref 38–126)
ALT: 147 U/L — AB (ref 17–63)
AST: 68 U/L — AB (ref 15–41)
Albumin: 2.6 g/dL — ABNORMAL LOW (ref 3.5–5.0)
Anion gap: 9 (ref 5–15)
BUN: 11 mg/dL (ref 6–20)
CALCIUM: 8.6 mg/dL — AB (ref 8.9–10.3)
CHLORIDE: 102 mmol/L (ref 101–111)
CO2: 24 mmol/L (ref 22–32)
CREATININE: 1.35 mg/dL — AB (ref 0.61–1.24)
GFR calc Af Amer: >60 mL/min (ref >60)
GFR calc non Af Amer: 58 mL/min — ABNORMAL LOW (ref >60)
GLUCOSE: 98 mg/dL (ref 65–99)
Potassium: 4.1 mmol/L (ref 3.5–5.1)
SODIUM: 135 mmol/L (ref 135–145)
Total Bilirubin: 0.8 mg/dL (ref 0.3–1.2)
Total Protein: 6.7 g/dL (ref 6.5–8.1)

## 2015-07-07 LAB — BETA-2-GLYCOPROTEIN I ABS, IGG/M/A
Beta-2-Glycoprotein I IgA: <9 GPI IgA units (ref 0–25)
Beta-2-Glycoprotein I IgM: <9 GPI IgM units (ref 0–32)

## 2015-07-07 LAB — URINE CULTURE
Culture: NO GROWTH
Special Requests: NORMAL

## 2015-07-07 LAB — CBC
HCT: 37.9 % — ABNORMAL LOW (ref 39.0–52.0)
HEMOGLOBIN: 12.7 g/dL — AB (ref 13.0–17.0)
MCH: 28.9 pg (ref 26.0–34.0)
MCHC: 33.5 g/dL (ref 30.0–36.0)
MCV: 86.3 fL (ref 78.0–100.0)
PLATELETS: 361 10*3/uL (ref 150–400)
RBC: 4.39 MIL/uL (ref 4.22–5.81)
RDW: 12.7 % (ref 11.5–15.5)
WBC: 12.2 10*3/uL — ABNORMAL HIGH (ref 4.0–10.5)

## 2015-07-07 LAB — ECHOCARDIOGRAM COMPLETE
Height: 68 in
WEIGHTICAEL: 2994.73 [oz_av]

## 2015-07-07 LAB — HEPARIN LEVEL (UNFRACTIONATED): Heparin Unfractionated: <0.1 IU/mL — ABNORMAL LOW (ref 0.30–0.70)

## 2015-07-07 LAB — PROTEIN S, TOTAL: PROTEIN S AG TOTAL: 186 % — AB (ref 60–150)

## 2015-07-07 LAB — CARDIOLIPIN ANTIBODIES, IGG, IGM, IGA
Anticardiolipin IgG: <9 GPL U/mL (ref 0–14)
Anticardiolipin IgM: <9 MPL U/mL (ref 0–12)

## 2015-07-07 LAB — PROTEIN C ACTIVITY: PROTEIN C ACTIVITY: 104 % (ref 73–180)

## 2015-07-07 LAB — HOMOCYSTEINE: Homocysteine: 10.9 umol/L (ref 0.0–15.0)

## 2015-07-07 LAB — PROTEIN S ACTIVITY: PROTEIN S ACTIVITY: 118 % (ref 63–140)

## 2015-07-07 LAB — PROTEIN C, TOTAL: PROTEIN C, TOTAL: 73 % (ref 60–150)

## 2015-07-07 MED ORDER — HEPARIN BOLUS VIA INFUSION
2000.0000 [IU] | Freq: Once | INTRAVENOUS | Status: AC
  Administered 2015-07-07: 2000 [IU] via INTRAVENOUS
  Filled 2015-07-07: qty 2000

## 2015-07-07 MED ORDER — ENOXAPARIN SODIUM 100 MG/ML ~~LOC~~ SOLN
1.0000 mg/kg | Freq: Two times a day (BID) | SUBCUTANEOUS | Status: DC
  Administered 2015-07-07 – 2015-07-08 (×2): 85 mg via SUBCUTANEOUS
  Filled 2015-07-07 (×2): qty 1

## 2015-07-07 NOTE — Progress Notes (Signed)
[facility] PROGRESS NOTE  Brad Robinson [id number] DOB: May 18, 1960 DOA: 07/04/2015   PCP: No primary care provider on file.  Length of stay: 2   Assessment/Plan: Principal Problem:   Pyelonephritis Active Problems:   Renal infarct (HCC)   Leukocytosis   AKI (acute kidney injury) ([place])   Transaminitis   Benign essential HTN   Brief summary 55 year old male with a past medical history significant for hypertension who presents complaining of abdominal cramping/spasms. Symptoms started 3 days ago after eating chicken from [place]. Thereafter reports having hot flashes, chills, sweats, nausea, vomiting, and whole-body muscle spasms. Reports being not able to keep any food or liquids down. Reports significant pains in the lower abdomen that radiates upward. Denies any chest pain, focal weakness, changes in vision, or rash. Patient never had symptoms like this previously in the past.   Upon Admission to the emergency department patient was evaluated with CT scan of abdomen which revealed multiple wedge-shaped lesions suggestive of pyelonephritis versus renal infarctions. Urine analysis was negative. Patient was started on IV fluids, Rocephin, and transferred from [facility] [place] to a MedSurg bed for the hospitalist group to dependent.  Assessment and plan  Probable Pyelonephritis vs. Renal infarct:. CT reporting the possibility of bilateral pyelonephritis versus bilateral renal infarcts. UA appeared to be negative. Telemetry does not show atrial fibrillation Blood culture from 4/16, no growth so far - Placed on empiric antibiotics of Rocephin  Echo to r/o low EF, pending Hypercoagulable panel pending No evidence of renal artery stenosis on renal Doppler  Repeat CT scan in 48 hours, hold off on Coumadin for now pending repeat CT scan, was started on heparin drip, patient refusing frequently last therefore switch  to Lovenox  Leukocytosis: WBC 15.4 Lactic acid  within normal limits   Acute kidney injury: Baseline creatinine somewhere around 1.1, but on admission elevated to 1.63>1.41. Cause unknown with suspected aspect of prerenal. - IVF of NS at 162ml/hr    Essential hypertension: Stable - Continue to monitor  Transaminitis: Secondary to dehydration - Continue to monitor   DVT prophylaxsis heparin gtt   Code Status:      Code Status Orders        Start     Ordered   07/05/15 0557  Full code   Continuous     07/05/15 0559    Code Status History    Date Active Date Inactive Code Status Order ID Comments User Context   This patient has a current code status but no historical code status.      Family Communication: Discussed in detail with the patient, all imaging results, lab results explained to the patient   Disposition Plan: CT scan in the next 24-48 hours, follow culture results     Consultants:  None  Procedures:  None  Antibiotics: Anti-infectives    Start     Dose/Rate Route Frequency Ordered Stop   07/05/15 2200  cefTRIAXone (ROCEPHIN) 2 g in dextrose 5 % 50 mL IVPB     2 g 100 mL/hr over 30 Minutes Intravenous Every 24 hours 07/05/15 0700        HPI/Subjective: Patient continues to have low-grade fevers Refusing blood work  Objective: Filed Vitals:   07/06/15 1033 07/06/15 1637 07/06/15 2053 07/07/15 0500  BP: 161/96 142/92 168/100 157/99  Pulse: 78 73 81 80  Temp: 99.8 F (37.7 C) 100.2 F (37.9 C) 98.6 F (37 C) 98.9 F (37.2 C)  TempSrc: Oral  Oral Oral Oral  Resp: 17 18 20 20   Height:      Weight:   84.9 kg (187 lb 2.7 oz)   SpO2: 100% 100% 99% 98%    Intake/Output Summary (Last 24 hours) at 07/07/15 0815 Last data filed at 07/07/15 0600  Gross per 24 hour  Intake 3635.66 ml  Output   1250 ml  Net 2385.66 ml    Exam:  Examination:  General exam: Appears calm and comfortable  Respiratory system: Clear to auscultation. Respiratory effort normal. Cardiovascular system: S1 &  S2 heard, RRR. No JVD, murmurs, rubs, gallops or clicks. No pedal edema. Gastrointestinal system: Abdomen is nondistended, soft and nontender. No organomegaly or masses felt. Normal bowel sounds heard. Central nervous system: Alert and oriented. No focal neurological deficits. Extremities: Symmetric 5 x 5 power. Skin: No rashes, lesions or ulcers Psychiatry: Judgement and insight appear normal. Mood & affect appropriate.     Data Reviewed: I have personally reviewed following labs and imaging studies  Micro Results Recent Results (from the past 240 hour(s))  Blood culture (routine x 2)     Status: None (Preliminary result)   Collection Time: 07/05/15  3:26 AM  Result Value Ref Range Status   Specimen Description BLOOD RIGHT ARM  Final   Special Requests   Final    BOTTLES DRAWN AEROBIC AND ANAEROBIC AER 10cc ANA 10cc   Culture   Final    NO GROWTH 1 DAY Performed at [facility]    Report Status PENDING  Incomplete  Blood culture (routine x 2)     Status: None (Preliminary result)   Collection Time: 07/05/15  3:33 AM  Result Value Ref Range Status   Specimen Description BLOOD RIGHT WRIST  Final   Special Requests   Final    BOTTLES DRAWN AEROBIC AND ANAEROBIC AER 5cc ANA 5cc   Culture   Final    NO GROWTH 1 DAY Performed at [facility]    Report Status PENDING  Incomplete    Radiology Reports Ct Abdomen Pelvis W Contrast  07/05/2015  CLINICAL DATA:  Cramping abdominal pain, onset 3 days ago. EXAM: CT ABDOMEN AND PELVIS WITH CONTRAST TECHNIQUE: Multidetector CT imaging of the abdomen and pelvis was performed using the standard protocol following bolus administration of intravenous contrast. CONTRAST:  15mL ISOVUE-300 IOPAMIDOL (ISOVUE-300) INJECTION 61% COMPARISON:  None. FINDINGS: Lower chest:  No significant abnormality Hepatobiliary: There are normal appearances of the liver, gallbladder and bile ducts. Pancreas: Normal Spleen: Normal Adrenals/Urinary Tract:  Both adrenals are normal. There are multiple wedge-shaped low-attenuation abnormalities in both kidneys, involving cortex and medulla. This may represent pyelonephritis. Renal infarctions could also produce this appearance. No discrete renal mass is evident. No obstructive uropathy. The ureters and urinary bladder are unremarkable. Stomach/Bowel: There are normal appearances of the stomach, small bowel and colon. The appendix is normal. Vascular/Lymphatic: The abdominal aorta is normal in caliber. There is mild atherosclerotic calcification. There is no adenopathy in the abdomen or pelvis. Reproductive: Unremarkable Other: No ascites. Musculoskeletal: No significant skeletal lesion. Moderately severe degenerative lumbar disc disease is present at L4 through the sacrum. IMPRESSION: Both kidneys are abnormal, with multiple wedge-shaped peripherally based areas of hypodensity. This may represent bilateral pyelonephritis. Renal infarctions may be considered as a less likely possibility. Electronically Signed   By: Andreas Newport M.D.   On: 07/05/2015 02:13     CBC  Recent Labs Lab 07/04/15 2359 07/05/15 1010 07/06/15 0532  WBC 15.4* 13.4*  14.0*  HGB 14.4 13.7 12.4*  HCT 41.4 40.3 36.7*  PLT 323 327 315  MCV 86.3 86.7 86.4  MCH 30.0 29.5 29.2  MCHC 34.8 34.0 33.8  RDW 12.7 12.9 12.8  LYMPHSABS 1.7  --   --   MONOABS 1.4*  --   --   EOSABS 0.0  --   --   BASOSABS 0.1  --   --     Chemistries   Recent Labs Lab 07/04/15 2359 07/05/15 1010 07/06/15 0532  NA 132* 134* 134*  K 3.6 3.4* 3.8  CL 98* 96* 100*  CO2 25 25 25   GLUCOSE 115* 110* 98  BUN 19 12 10   CREATININE 1.63* 1.62* 1.41*  CALCIUM 8.7* 8.5* 8.3*  MG  --  2.0  --   AST 88*  --  85*  ALT 114*  --  138*  ALKPHOS 78  --  80  BILITOT 0.8  --  0.9   ------------------------------------------------------------------------------------------------------------------ estimated creatinine clearance is 64.3 mL/min (by C-G  formula based on Cr of 1.41). ------------------------------------------------------------------------------------------------------------------ No results for input(s): HGBA1C in the last 72 hours. ------------------------------------------------------------------------------------------------------------------ No results for input(s): CHOL, HDL, LDLCALC, TRIG, CHOLHDL, LDLDIRECT in the last 72 hours. ------------------------------------------------------------------------------------------------------------------  Recent Labs  07/05/15 1010  TSH 1.222   ------------------------------------------------------------------------------------------------------------------ No results for input(s): VITAMINB12, FOLATE, FERRITIN, TIBC, IRON, RETICCTPCT in the last 72 hours.  Coagulation profile  Recent Labs Lab 07/05/15 1010  INR 1.22    No results for input(s): DDIMER in the last 72 hours.  Cardiac Enzymes No results for input(s): CKMB, TROPONINI, MYOGLOBIN in the last 168 hours.  Invalid input(s): CK ------------------------------------------------------------------------------------------------------------------ Invalid input(s): POCBNP   CBG: No results for input(s): GLUCAP in the last 168 hours.     Studies: No results found.    No results found for: HGBA1C Lab Results  Component Value Date   CREATININE 1.41* 07/06/2015       Scheduled Meds: . cefTRIAXone (ROCEPHIN)  IV  2 g Intravenous Q24H   Continuous Infusions: . sodium chloride 125 mL/hr at 07/07/15 0320  . heparin 1,350 Units/hr (07/07/15 0122)    Principal Problem:   Pyelonephritis Active Problems:   Renal infarct (HCC)   Leukocytosis   AKI (acute kidney injury) ([place])   Transaminitis   Benign essential HTN    Time spent: 45 minutes   [place] [facility] Pager [phone]. If 7PM-7AM, please contact night-coverage at [email or web address], password [facility] 07/07/2015, 8:15 AM  LOS: 2 days

## 2015-07-07 NOTE — Progress Notes (Signed)
ANTICOAGULATION CONSULT NOTE - Follow Up Consult  Pharmacy Consult for Heparin  Indication: Possible bilateral renal infarctions  No Known Allergies  Patient Measurements: Height: 5\' 8"  (172.7 cm) Weight: 187 lb 2.7 oz (84.9 kg) IBW/kg (Calculated) : 68.4  Vital Signs: Temp: 98.6 F (37 C) (04/17 2053) Temp Source: Oral (04/17 2053) BP: 168/100 mmHg (04/17 2053) Pulse Rate: 81 (04/17 2053)  Labs:  Recent Labs  07/04/15 2359 07/05/15 1010 07/06/15 0532 07/06/15 1703 07/06/15 2234  HGB 14.4 13.7 12.4*  --   --   HCT 41.4 40.3 36.7*  --   --   PLT 323 327 315  --   --   LABPROT  --  15.6*  --   --   --   INR  --  1.22  --   --   --   HEPARINUNFRC  --   --   --  0.32 <0.10*  CREATININE 1.63* 1.62* 1.41*  --   --     Estimated Creatinine Clearance: 64.3 mL/min (by C-G formula based on Cr of 1.41).   Assessment: Heparin level undetectable tonight, infusing good per RN  Goal of Therapy:  Heparin level 0.3-0.7 units/ml Monitor platelets by anticoagulation protocol: Yes   Plan:  -Heparin 2000 units BOLUS -Increase heparin to 1350 units/hr -0830 HL -F/U renal infarct work-up  [place], [place] 07/07/2015,12:40 AM

## 2015-07-07 NOTE — Progress Notes (Signed)
  Echocardiogram 2D Echocardiogram has been performed.  Brad Robinson 07/07/2015, 11:41 AM

## 2015-07-07 NOTE — Progress Notes (Addendum)
[place] for heparin>>Lovenox Indication: renal infarcts  No Known Allergies  Patient Measurements: Height: 5\' 8"  (172.7 cm) Weight: 187 lb 2.7 oz (84.9 kg) IBW/kg (Calculated) : 68.4 Heparin Dosing Weight: 83kg  Vital Signs: Temp: 98.7 F (37.1 C) (04/18 0941) Temp Source: Oral (04/18 0941) BP: 148/88 mmHg (04/18 0941) Pulse Rate: 79 (04/18 0941)  Labs:  Recent Labs  07/05/15 1010 07/06/15 0532 07/06/15 1703 07/06/15 2234 07/07/15 0826  HGB 13.7 12.4*  --   --  12.7*  HCT 40.3 36.7*  --   --  37.9*  PLT 327 315  --   --  361  LABPROT 15.6*  --   --   --   --   INR 1.22  --   --   --   --   HEPARINUNFRC  --   --  0.32 <0.10* <0.10*  CREATININE 1.62* 1.41*  --   --  1.35*    Estimated Creatinine Clearance: 67.1 mL/min (by C-G formula based on Cr of 1.35).   Assessment: 53 YOM admitted with pyelonephritis vs bilateral renal infarcts. Concern for embolic source. Was on prophylactic Lovenox, now to switched to IV heparin.  Hgb 12.7, plts 361- stable, no bleeding noted.  Heparin level was low late last night and patient was re-bolused and rate increased. Repeat level still undetectable- confirmed no issues with line/infusion and running at correct rate.  Goal of Therapy:  Heparin level 0.3-0.7 units/ml Monitor platelets by anticoagulation protocol: Yes   Plan:  -heparin bolus 2000 units IV x1, then increase infusion to 1600units/hr -heparin level in 6h -daily HL and CBC -follow workup and need for long term anticoagulation  Burgandy Hackworth D. Devlin Brink, PharmD, BCPS Clinical Pharmacist Pager: [phone] 07/07/2015 11:00 AM    ADDENDUM  Patient with refusal of some procedures/meds, so switching to Lovenox to limit blood draws. Bolus and rate increase as ordered above already completed.  Plan: -since patient just received a heparin bolus and to simplify dosing for him so a dose is not due in the middle of the night, will STOP  heparin at 1700 -Lovenox 1mg /kg (85mg ) q12h starting at 1800 (1h after heparin infusion stopped) -CBC q72h -follow for long term [facility] plans -plan communicated with RN Rayna Sexton D. Ladashia Demarinis, PharmD, BCPS Clinical Pharmacist Pager: [phone] 07/07/2015 11:33 AM

## 2015-07-08 ENCOUNTER — Inpatient Hospital Stay (HOSPITAL_COMMUNITY): Payer: Medicare Other

## 2015-07-08 ENCOUNTER — Encounter (HOSPITAL_COMMUNITY): Payer: Self-pay | Admitting: Radiology

## 2015-07-08 LAB — HEXAGONAL PHASE PHOSPHOLIPID: Hexagonal Phase Phospholipid: 9 s (ref 0–11)

## 2015-07-08 LAB — LUPUS ANTICOAGULANT PANEL
DRVVT: 49.6 s — AB (ref 0.0–44.0)
PTT Lupus Anticoagulant: 54.5 s — ABNORMAL HIGH (ref 0.0–43.6)

## 2015-07-08 LAB — DRVVT MIX: dRVVT Mix: 43.2 s (ref 0.0–44.0)

## 2015-07-08 LAB — PTT-LA MIX: PTT-LA MIX: 52.5 s — AB (ref 0.0–40.6)

## 2015-07-08 MED ORDER — ENOXAPARIN SODIUM 100 MG/ML ~~LOC~~ SOLN
90.0000 mg | Freq: Two times a day (BID) | SUBCUTANEOUS | Status: DC
  Administered 2015-07-08 – 2015-07-09 (×2): 90 mg via SUBCUTANEOUS
  Filled 2015-07-08 (×2): qty 1

## 2015-07-08 MED ORDER — IOPAMIDOL (ISOVUE-300) INJECTION 61%
INTRAVENOUS | Status: AC
Start: 2015-07-08 — End: 2015-07-08
  Administered 2015-07-08: 100 mL
  Filled 2015-07-08: qty 100

## 2015-07-08 NOTE — Care Management Important Message (Signed)
Important Message  Patient Details  Name: Brad Robinson MRN: [id number] Date of Birth: 1961/01/17   Medicare Important Message Given:  Yes    Clavin Ruhlman Abena 07/08/2015, 10:03 AM

## 2015-07-08 NOTE — Progress Notes (Addendum)
[facility] PROGRESS NOTE  Brad Robinson [id number] DOB: 10-18-1960 DOA: 07/04/2015   PCP: No primary care provider on file.  Length of stay: 3   Assessment/Plan: Principal Problem:   Pyelonephritis Active Problems:   Renal infarct (HCC)   Leukocytosis   AKI (acute kidney injury) ([place])   Transaminitis   Benign essential HTN   Brief summary 55 year old male with a past medical history significant for hypertension who presents complaining of abdominal cramping/spasms. Symptoms started 3 days ago after eating chicken from [place]. Thereafter reports having hot flashes, chills, sweats, nausea, vomiting, and whole-body muscle spasms. Reports being not able to keep any food or liquids down. Reports significant pains in the lower abdomen that radiates upward. Denies any chest pain, focal weakness, changes in vision, or rash. Patient never had symptoms like this previously in the past.   Upon Admission to the emergency department patient was evaluated with CT scan of abdomen which revealed multiple wedge-shaped lesions suggestive of pyelonephritis versus renal infarctions. Urine analysis was negative. Patient was started on IV fluids, Rocephin, and transferred from [facility] to  the hospitalist group for admission for acute pyelonephritis  Assessment and plan  Probable Pyelonephritis vs. Renal infarct:. CT reporting the possibility of bilateral pyelonephritis versus bilateral renal infarcts. UA appeared to be negative. Telemetry does not show atrial fibrillation Blood culture from 4/16, no growth so far - Placed on empiric antibiotics of Rocephin  Echo to r/o low EF, shows normal EF, no source of cardiac emboli was identified Hypercoagulable panel shows slightly elevated lupus anticoagulant levels, likely the setting of heparin No evidence of renal artery stenosis on renal Doppler  Repeat CT scan 4/19 to definitely rule out bilateral renal infarcts, hold off on  Coumadin for now pending repeat CT scan,  Initially started on heparin drip, refusing frequent labs therefore switch  to Lovenox Discontinue therapeutic dose of Lovenox if no evidence of renal infarction on CT scan today Patient may be able to discharge home tomorrow with 10 more days of by mouth antibiotics. -Ciprofloxacin 500 mg by mouth twice a day 10 days   Leukocytosis: WBC 15.4 Lactic acid within normal limits   Acute kidney injury: Baseline creatinine somewhere around 1.1, but on admission elevated to 1.63>1.41>1.35. Cause unknown with suspected aspect of prerenal. - IVF of NS at 16ml/hr    Essential hypertension: Stable - Continue to monitor  Transaminitis: Secondary to dehydration - Continue to monitor   DVT prophylaxsis heparin gtt   Code Status:      Code Status Orders        Start     Ordered   07/05/15 0557  Full code   Continuous     07/05/15 0559    Code Status History    Date Active Date Inactive Code Status Order ID Comments User Context   This patient has a current code status but no historical code status.      Family Communication: Discussed in detail with the patient, all imaging results, lab results explained to the patient   Disposition Plan: Anticipate discharge in one to 2 days, ambulate patient    Consultants:  None  Procedures:  None  Antibiotics: Rocephin 2 g IV daily, started on 4/16      HPI/Subjective: Anxious to telemetry removed, back pain somewhat improved since yesterday would like to ambulate  Objective: Filed Vitals:   07/07/15 0941 07/07/15 1703 07/07/15 2110 07/08/15 0624  BP: 148/88 164/98 167/91  155/93  Pulse: 79 72 78 66  Temp: 98.7 F (37.1 C) 99 F (37.2 C) 99.2 F (37.3 C) 98 F (36.7 C)  TempSrc: Oral Oral    Resp: 19 20 18 15   Height:      Weight:   90.1 kg (198 lb 10.2 oz)   SpO2: 100% 100% 100% 100%    Intake/Output Summary (Last 24 hours) at 07/08/15 [id number] Last data filed at 07/08/15  0700  Gross per 24 hour  Intake 2600.83 ml  Output   2975 ml  Net -374.17 ml    Exam:  Examination:  General exam: Appears calm and comfortable  Respiratory system: Clear to auscultation. Respiratory effort normal. Cardiovascular system: S1 & S2 heard, RRR. No JVD, murmurs, rubs, gallops or clicks. No pedal edema. Gastrointestinal system: Abdomen is nondistended, soft and nontender. No organomegaly or masses felt. Normal bowel sounds heard. Central nervous system: Alert and oriented. No focal neurological deficits. Extremities: Symmetric 5 x 5 power. Skin: No rashes, lesions or ulcers Psychiatry: Judgement and insight appear normal. Mood & affect appropriate.     Data Reviewed: I have personally reviewed following labs and imaging studies  Micro Results Recent Results (from the past 240 hour(s))  Urine culture     Status: None   Collection Time: 07/05/15  1:20 AM  Result Value Ref Range Status   Specimen Description URINE, CLEAN CATCH  Final   Special Requests Normal  Final   Culture   Final    NO GROWTH 1 DAY Performed at [facility]    Report Status 07/07/2015 FINAL  Final  Blood culture (routine x 2)     Status: None (Preliminary result)   Collection Time: 07/05/15  3:26 AM  Result Value Ref Range Status   Specimen Description BLOOD RIGHT ARM  Final   Special Requests   Final    BOTTLES DRAWN AEROBIC AND ANAEROBIC AER 10cc ANA 10cc   Culture   Final    NO GROWTH 2 DAYS Performed at [facility]    Report Status PENDING  Incomplete  Blood culture (routine x 2)     Status: None (Preliminary result)   Collection Time: 07/05/15  3:33 AM  Result Value Ref Range Status   Specimen Description BLOOD RIGHT WRIST  Final   Special Requests   Final    BOTTLES DRAWN AEROBIC AND ANAEROBIC AER 5cc ANA 5cc   Culture   Final    NO GROWTH 2 DAYS Performed at [facility]    Report Status PENDING  Incomplete    Radiology Reports Ct Abdomen Pelvis W  Contrast  07/05/2015  CLINICAL DATA:  Cramping abdominal pain, onset 3 days ago. EXAM: CT ABDOMEN AND PELVIS WITH CONTRAST TECHNIQUE: Multidetector CT imaging of the abdomen and pelvis was performed using the standard protocol following bolus administration of intravenous contrast. CONTRAST:  165mL ISOVUE-300 IOPAMIDOL (ISOVUE-300) INJECTION 61% COMPARISON:  None. FINDINGS: Lower chest:  No significant abnormality Hepatobiliary: There are normal appearances of the liver, gallbladder and bile ducts. Pancreas: Normal Spleen: Normal Adrenals/Urinary Tract: Both adrenals are normal. There are multiple wedge-shaped low-attenuation abnormalities in both kidneys, involving cortex and medulla. This may represent pyelonephritis. Renal infarctions could also produce this appearance. No discrete renal mass is evident. No obstructive uropathy. The ureters and urinary bladder are unremarkable. Stomach/Bowel: There are normal appearances of the stomach, small bowel and colon. The appendix is normal. Vascular/Lymphatic: The abdominal aorta is normal in caliber. There is mild atherosclerotic  calcification. There is no adenopathy in the abdomen or pelvis. Reproductive: Unremarkable Other: No ascites. Musculoskeletal: No significant skeletal lesion. Moderately severe degenerative lumbar disc disease is present at L4 through the sacrum. IMPRESSION: Both kidneys are abnormal, with multiple wedge-shaped peripherally based areas of hypodensity. This may represent bilateral pyelonephritis. Renal infarctions may be considered as a less likely possibility. Electronically Signed   By: Andreas Newport M.D.   On: 07/05/2015 02:13     CBC  Recent Labs Lab 07/04/15 2359 07/05/15 1010 07/06/15 0532 07/07/15 0826  WBC 15.4* 13.4* 14.0* 12.2*  HGB 14.4 13.7 12.4* 12.7*  HCT 41.4 40.3 36.7* 37.9*  PLT 323 327 315 361  MCV 86.3 86.7 86.4 86.3  MCH 30.0 29.5 29.2 28.9  MCHC 34.8 34.0 33.8 33.5  RDW 12.7 12.9 12.8 12.7  LYMPHSABS  1.7  --   --   --   MONOABS 1.4*  --   --   --   EOSABS 0.0  --   --   --   BASOSABS 0.1  --   --   --     Chemistries   Recent Labs Lab 07/04/15 2359 07/05/15 1010 07/06/15 0532 07/07/15 0826  NA 132* 134* 134* 135  K 3.6 3.4* 3.8 4.1  CL 98* 96* 100* 102  CO2 25 25 25 24   GLUCOSE 115* 110* 98 98  BUN 19 12 10 11   CREATININE 1.63* 1.62* 1.41* 1.35*  CALCIUM 8.7* 8.5* 8.3* 8.6*  MG  --  2.0  --   --   AST 88*  --  85* 68*  ALT 114*  --  138* 147*  ALKPHOS 78  --  80 102  BILITOT 0.8  --  0.9 0.8   ------------------------------------------------------------------------------------------------------------------ estimated creatinine clearance is 69 mL/min (by C-G formula based on Cr of 1.35). ------------------------------------------------------------------------------------------------------------------ No results for input(s): HGBA1C in the last 72 hours. ------------------------------------------------------------------------------------------------------------------ No results for input(s): CHOL, HDL, LDLCALC, TRIG, CHOLHDL, LDLDIRECT in the last 72 hours. ------------------------------------------------------------------------------------------------------------------  Recent Labs  07/05/15 1010  TSH 1.222   ------------------------------------------------------------------------------------------------------------------ No results for input(s): VITAMINB12, FOLATE, FERRITIN, TIBC, IRON, RETICCTPCT in the last 72 hours.  Coagulation profile  Recent Labs Lab 07/05/15 1010  INR 1.22    No results for input(s): DDIMER in the last 72 hours.  Cardiac Enzymes No results for input(s): CKMB, TROPONINI, MYOGLOBIN in the last 168 hours.  Invalid input(s): CK ------------------------------------------------------------------------------------------------------------------ Invalid input(s): POCBNP   CBG: No results for input(s): GLUCAP in the last 168  hours.     Studies: No results found.    No results found for: HGBA1C Lab Results  Component Value Date   CREATININE 1.35* 07/07/2015       Scheduled Meds: . cefTRIAXone (ROCEPHIN)  IV  2 g Intravenous Q24H  . enoxaparin (LOVENOX) injection  1 mg/kg Subcutaneous Q12H   Continuous Infusions: . sodium chloride 75 mL/hr at 07/08/15 0047    Principal Problem:   Pyelonephritis Active Problems:   Renal infarct (HCC)   Leukocytosis   AKI (acute kidney injury) ([place])   Transaminitis   Benign essential HTN    Time spent: 45 minutes   [facility] Pager [phone]. If 7PM-7AM, please contact night-coverage at [email or web address], password [facility] 07/08/2015, 9:28 AM  LOS: 3 days

## 2015-07-08 NOTE — Progress Notes (Addendum)
ANTICOAGULATION & ANTIBIOTIC CONSULT NOTE - Follow Up Consult  Pharmacy Consult for Lovenox + Rocephin Indication: r/o bilateral renal infarcts & empiric pyelonephritis coverage  No Known Allergies  Patient Measurements: Height: 5\' 8"  (172.7 cm) Weight: 198 lb 10.2 oz (90.1 kg) IBW/kg (Calculated) : 68.4  Vital Signs: Temp: 98.3 F (36.8 C) (04/19 0959) Temp Source: Oral (04/19 0959) BP: 145/91 mmHg (04/19 0959) Pulse Rate: 72 (04/19 0959)  Labs:  Recent Labs  07/06/15 0532 07/06/15 1703 07/06/15 2234 07/07/15 0826  HGB 12.4*  --   --  12.7*  HCT 36.7*  --   --  37.9*  PLT 315  --   --  361  HEPARINUNFRC  --  0.32 <0.10* <0.10*  CREATININE 1.41*  --   --  1.35*    Estimated Creatinine Clearance: 69 mL/min (by C-G formula based on Cr of 1.35).   Medications:  Lovenox 85 mg SQ every 12 hours Ceftriaxone 2g IV every 24 hours  Assessment: 65 YOM admitted with pyelonephritis vs bilateral renal infarcts. Concern for embolic source. The patient was switched from IV heparin to SQ lovenox on 4/18 since the patient was refusing some blood draws.  No repeat BMET today - renal function appears stable/improving from prior labs. The patient's weight has been updated to reflect 90 kg - so will adjust the lovenox dose accordingly today.  The patient also continues on Rocephin for empiric pyelonephritis coverage. Given the patient's negative UCx - de-escalation to an oral antibiotic should be considered. MD considering changing to Cipro.   Goal of Therapy:  Anti-Xa level 0.6-1 units/ml 4hrs after LMWH dose given Monitor platelets by anticoagulation protocol: Yes   Plan:  1. Adjust lovenox to 90 mg SQ every 12 hours 2. Continue Ceftriaxone 2g IV every 24 hours  3. Discussed antibiotic de-escalation with MD 4. Will continue to monitor for any signs/symptoms of bleeding and will monitor changes in renal function and weight for any necessary dose adjustments 5. Will continue to  follow renal function, culture results, LOT, and antibiotic de-escalation plans   Alycia Rossetti, PharmD, BCPS Clinical Pharmacist Pager: [phone] 07/08/2015 10:21 AM

## 2015-07-09 DIAGNOSIS — D72829 Elevated white blood cell count, unspecified: Secondary | ICD-10-CM

## 2015-07-09 DIAGNOSIS — N12 Tubulo-interstitial nephritis, not specified as acute or chronic: Secondary | ICD-10-CM

## 2015-07-09 DIAGNOSIS — N179 Acute kidney failure, unspecified: Secondary | ICD-10-CM

## 2015-07-09 DIAGNOSIS — I10 Essential (primary) hypertension: Secondary | ICD-10-CM

## 2015-07-09 LAB — BASIC METABOLIC PANEL
Anion gap: 12 (ref 5–15)
BUN: 11 mg/dL (ref 6–20)
CALCIUM: 8.6 mg/dL — AB (ref 8.9–10.3)
CO2: 22 mmol/L (ref 22–32)
CREATININE: 1.32 mg/dL — AB (ref 0.61–1.24)
Chloride: 101 mmol/L (ref 101–111)
GFR calc non Af Amer: >60 mL/min (ref >60)
GLUCOSE: 103 mg/dL — AB (ref 65–99)
Potassium: 4.2 mmol/L (ref 3.5–5.1)
Sodium: 135 mmol/L (ref 135–145)

## 2015-07-09 MED ORDER — CIPROFLOXACIN HCL 500 MG PO TABS: 500.0000 mg | ORAL_TABLET | Freq: Two times a day (BID) | ORAL | Status: DC

## 2015-07-09 NOTE — Discharge Summary (Signed)
Discharge Summary  Brad Robinson [id number] DOB: Jun 03, 1960  PCP: No primary care provider on file.  Admit date: 07/04/2015 Discharge date: 07/09/2015  Time spent: 25 minutes   Recommendations for Outpatient Follow-up:  1. New medication: Cipro 500 mg by mouth twice a day 10 days   Discharge Diagnoses:  Active Hospital Problems   Diagnosis Date Noted  . Pyelonephritis 07/05/2015  . 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 Leukocytosis 07/05/2015  . AKI (acute kidney injury) ([place]) 07/05/2015  . Transaminitis 07/05/2015  . Benign essential HTN 07/05/2015    Resolved Hospital Problems   Diagnosis Date Noted Date Resolved  No resolved problems to display.    Discharge Condition: Improved, being discharged home   Diet recommendation:  Low-sodium  Filed Vitals:   07/09/15 0853 07/09/15 1347  BP: 172/98 160/98  Pulse: 62 71  Temp: 98.5 F (36.9 C) 98.2 F (36.8 C)  Resp: 18 18    History of present illness:  55 year old male with past mental history significant for hypertension admitted on 4/15 after 3 days of abdominal cramping with spasms. Patient initially thought that this was food poisoning. He complained of hot flashes chills and sweats. In the emergency room, patient had a normal urinalysis, but CT of the abdomen and multiple wedge-shaped lesion suggestive of pyelonephritis bilaterally versus renal infarction. Patient started on IV fluids, IV heparin and Rocephin and admitted to the hospitalist service.  Hospital Course:  Principal Problem:   Pyelonephritis: Patient started on empiric antibiotics. Echocardiogram done noted normal ejection fraction with no source of cardiac emboli identified. Hypercoagulable panel only noted slightly elevated lupus" levels, likely in the setting of heparin. No evidence of renal artery stenosis done on renal Doppler. Patient underwent a repeat CT scan on 4/19 which noted no evidence of infarction and was able to see better perinephric  stranding more consistent with bilateral polynephritis. With this finding, patient able to be discharged on by mouth antibiotics 10 days for total of 2 weeks of therapy. Active Problems:    Leukocytosis: Steadily improving following IV fluids and antibiotics   AKI (acute kidney injury) ([place]) in the setting of stage II chronic kidney disease: Creatinine initially 1.63. With IV hydration, creatinine down to 1.32 with GFR greater than 60 by day of discharge.   Transaminitis: Secondary to shock liver from hypovolemia. By 4/18, almost near resolution   Benign essential HTN: Stable. Patient resumed on ACE inhibitor/HCTZ on discharge  Chronic diastolic heart failure: Noted on echocardiogram. Patient already on ACE inhibitor. Hypovolemic during hospitalization and euvolemic by discharge Procedures:  Echocardiogram done 0000000: Grade 1 diastolic dysfunction:  Consultations:  None   Discharge Exam: BP 160/98 mmHg  Pulse 71  Temp(Src) 98.2 F (36.8 C) (Oral)  Resp 18  Ht 5\' 8"  (1.727 m)  Wt 89.2 kg (196 lb 10.4 oz)  BMI 29.91 kg/m2  SpO2 100%  General: Alert and oriented 3  Cardiovascular: Regular rate and rhythm, S1-S2  Respiratory: Clear to auscultation bilaterally   Discharge Instructions You were cared for by a hospitalist during your hospital stay. If you have any questions about your discharge medications or the care you received while you were in the hospital after you are discharged, you can call the unit and asked to speak with the hospitalist on call if the hospitalist that took care of you is not available. Once you are discharged, your primary care physician will handle any further medical issues. Please note that NO REFILLS for any discharge medications will be authorized  once you are discharged, as it is imperative that you return to your primary care physician (or establish a relationship with a primary care physician if you do not have one) for your aftercare needs so that they  can reassess your need for medications and monitor your lab values.  Discharge Instructions    Diet - low sodium heart healthy    Complete by:  As directed      Increase activity slowly    Complete by:  As directed             Medication List    TAKE these medications        ciprofloxacin 500 MG tablet  Commonly known as:  CIPRO  Take 1 tablet (500 mg total) by mouth 2 (two) times daily.     lisinopril-hydrochlorothiazide 20-25 MG tablet  Commonly known as:  PRINZIDE,ZESTORETIC  Take 1 tablet by mouth daily.     ondansetron 4 MG tablet  Commonly known as:  ZOFRAN  Take 1 tablet (4 mg total) by mouth every 8 (eight) hours as needed for nausea or vomiting.       No Known Allergies     Follow-up Information    Schedule an appointment as soon as possible for a visit with [facility].   Why:  Make an appointment with your own primary care physician or this clinic for reevaluation   Contact information:   [address] [place] [phone]       The results of significant diagnostics from this hospitalization (including imaging, microbiology, ancillary and laboratory) are listed below for reference.    Significant Diagnostic Studies: Ct Abdomen Pelvis W Contrast  07/08/2015  CLINICAL DATA:  Recent abnormal CT with findings suggestive of pyelonephritis bilaterally. EXAM: CT ABDOMEN AND PELVIS WITH CONTRAST TECHNIQUE: Multidetector CT imaging of the abdomen and pelvis was performed using the standard protocol following bolus administration of intravenous contrast. CONTRAST:  136mL ISOVUE-300 IOPAMIDOL (ISOVUE-300) INJECTION 61% COMPARISON:  07/05/2015 FINDINGS: Lung bases demonstrate minimal bibasilar atelectasis. The liver, gallbladder, spleen, adrenal glands and pancreas are within normal limits. Kidneys are well visualized bilaterally and again demonstrates some patchy changes of decreased enhancement. These  findings are not significantly changed from the recent exam from 3 days previous with the exception of some slight increased perinephric stranding. Normal excretion of contrast is noted bilaterally. Again these changes are likely related to pyelonephritis. The appendix is within normal limits. Scattered mild diverticular change is seen without diverticulitis. The bladder is partially distended. No pelvic mass lesion or sidewall abnormality is noted no acute bony abnormality is noted. IMPRESSION: Changes in the kidneys bilaterally most consistent with bilateral pyelonephritis. The overall appearance is stable from the prior study. No definitive abscess is noted at this time. Electronically Signed   By: Inez Catalina M.D.   On: 07/08/2015 11:36   Ct Abdomen Pelvis W Contrast  07/05/2015  CLINICAL DATA:  Cramping abdominal pain, onset 3 days ago. EXAM: CT ABDOMEN AND PELVIS WITH CONTRAST TECHNIQUE: Multidetector CT imaging of the abdomen and pelvis was performed using the standard protocol following bolus administration of intravenous contrast. CONTRAST:  155mL ISOVUE-300 IOPAMIDOL (ISOVUE-300) INJECTION 61% COMPARISON:  None. FINDINGS: Lower chest:  No significant abnormality Hepatobiliary: There are normal appearances of the liver, gallbladder and bile ducts. Pancreas: Normal Spleen: Normal Adrenals/Urinary Tract: Both adrenals are normal. There are multiple wedge-shaped low-attenuation abnormalities in both kidneys, involving cortex and medulla. This may represent  pyelonephritis. Renal infarctions could also produce this appearance. No discrete renal mass is evident. No obstructive uropathy. The ureters and urinary bladder are unremarkable. Stomach/Bowel: There are normal appearances of the stomach, small bowel and colon. The appendix is normal. Vascular/Lymphatic: The abdominal aorta is normal in caliber. There is mild atherosclerotic calcification. There is no adenopathy in the abdomen or pelvis. Reproductive:  Unremarkable Other: No ascites. Musculoskeletal: No significant skeletal lesion. Moderately severe degenerative lumbar disc disease is present at L4 through the sacrum. IMPRESSION: Both kidneys are abnormal, with multiple wedge-shaped peripherally based areas of hypodensity. This may represent bilateral pyelonephritis. Renal infarctions may be considered as a less likely possibility. Electronically Signed   By: Andreas Newport M.D.   On: 07/05/2015 02:13    Microbiology: Recent Results (from the past 240 hour(s))  Urine culture     Status: None   Collection Time: 07/05/15  1:20 AM  Result Value Ref Range Status   Specimen Description URINE, CLEAN CATCH  Final   Special Requests Normal  Final   Culture   Final    NO GROWTH 1 DAY Performed at [facility]    Report Status 07/07/2015 FINAL  Final  Blood culture (routine x 2)     Status: None (Preliminary result)   Collection Time: 07/05/15  3:26 AM  Result Value Ref Range Status   Specimen Description BLOOD RIGHT ARM  Final   Special Requests   Final    BOTTLES DRAWN AEROBIC AND ANAEROBIC AER 10cc ANA 10cc   Culture   Final    NO GROWTH 4 DAYS Performed at [facility]    Report Status PENDING  Incomplete  Blood culture (routine x 2)     Status: None (Preliminary result)   Collection Time: 07/05/15  3:33 AM  Result Value Ref Range Status   Specimen Description BLOOD RIGHT WRIST  Final   Special Requests   Final    BOTTLES DRAWN AEROBIC AND ANAEROBIC AER 5cc ANA 5cc   Culture   Final    NO GROWTH 4 DAYS Performed at [facility]    Report Status PENDING  Incomplete     Labs: Basic Metabolic Panel:  Recent Labs Lab 07/04/15 2359 07/05/15 1010 07/06/15 0532 07/07/15 0826 07/09/15 0302  NA 132* 134* 134* 135 135  K 3.6 3.4* 3.8 4.1 4.2  CL 98* 96* 100* 102 101  CO2 25 25 25 24 22   GLUCOSE 115* 110* 98 98 103*  BUN 19 12 10 11 11   CREATININE 1.63* 1.62* 1.41* 1.35* 1.32*  CALCIUM 8.7* 8.5* 8.3*  8.6* 8.6*  MG  --  2.0  --   --   --    Liver Function Tests:  Recent Labs Lab 07/04/15 2359 07/06/15 0532 07/07/15 0826  AST 88* 85* 68*  ALT 114* 138* 147*  ALKPHOS 78 80 102  BILITOT 0.8 0.9 0.8  PROT 7.9 6.2* 6.7  ALBUMIN 3.8 2.6* 2.6*    Recent Labs Lab 07/04/15 2359  LIPASE 29   No results for input(s): AMMONIA in the last 168 hours. CBC:  Recent Labs Lab 07/04/15 2359 07/05/15 1010 07/06/15 0532 07/07/15 0826  WBC 15.4* 13.4* 14.0* 12.2*  NEUTROABS 12.3*  --   --   --   HGB 14.4 13.7 12.4* 12.7*  HCT 41.4 40.3 36.7* 37.9*  MCV 86.3 86.7 86.4 86.3  PLT 323 327 315 361   Cardiac Enzymes: No results for input(s): CKTOTAL, CKMB, CKMBINDEX, TROPONINI in the  last 168 hours. BNP: BNP (last 3 results) No results for input(s): BNP in the last 8760 hours.  ProBNP (last 3 results) No results for input(s): PROBNP in the last 8760 hours.  CBG: No results for input(s): GLUCAP in the last 168 hours.     Signed:  Annita Brod  [facility] 07/09/2015, 3:50 PM

## 2015-07-09 NOTE — Progress Notes (Signed)
Patient is to be discharged home.  Waiting on ride home.  Home medications retrieved from pharmacy and returned to patient.  PIV removed with complications.  Discharge instructions reviewed in detail and all questions answered.  [employer] RN-BC, WTA.

## 2015-07-09 NOTE — Discharge Instructions (Signed)
You were seen and evaluated today for your abdominal discomfort and vomiting. This appears to be secondary to likely viral infection. Use a nausea medicine prescribed. Follow-up with your primary care physician. Return with worsening symptoms or inability to hold down fluids and food at home.  Viral Gastroenteritis Viral gastroenteritis is also known as stomach flu. This condition affects the stomach and intestinal tract. It can cause sudden diarrhea and vomiting. The illness typically lasts 3 to 8 days. Most people develop an immune response that eventually gets rid of the virus. While this natural response develops, the virus can make you quite ill. CAUSES  Many different viruses can cause gastroenteritis, such as rotavirus or noroviruses. You can catch one of these viruses by consuming contaminated food or water. You may also catch a virus by sharing utensils or other personal items with an infected person or by touching a contaminated surface. SYMPTOMS  The most common symptoms are diarrhea and vomiting. These problems can cause a severe loss of body fluids (dehydration) and a body salt (electrolyte) imbalance. Other symptoms may include:  Fever.  Headache.  Fatigue.  Abdominal pain. DIAGNOSIS  Your caregiver can usually diagnose viral gastroenteritis based on your symptoms and a physical exam. A stool sample may also be taken to test for the presence of viruses or other infections. TREATMENT  This illness typically goes away on its own. Treatments are aimed at rehydration. The most serious cases of viral gastroenteritis involve vomiting so severely that you are not able to keep fluids down. In these cases, fluids must be given through an intravenous line (IV). HOME CARE INSTRUCTIONS   Drink enough fluids to keep your urine clear or pale yellow. Drink small amounts of fluids frequently and increase the amounts as tolerated.  Ask your caregiver for specific rehydration  instructions.  Avoid:  Foods high in sugar.  Alcohol.  Carbonated drinks.  Tobacco.  Juice.  Caffeine drinks.  Extremely hot or cold fluids.  Fatty, greasy foods.  Too much intake of anything at one time.  Dairy products until 24 to 48 hours after diarrhea stops.  You may consume probiotics. Probiotics are active cultures of beneficial bacteria. They may lessen the amount and number of diarrheal stools in adults. Probiotics can be found in yogurt with active cultures and in supplements.  Wash your hands well to avoid spreading the virus.  Only take over-the-counter or prescription medicines for pain, discomfort, or fever as directed by your caregiver. Do not give aspirin to children. Antidiarrheal medicines are not recommended.  Ask your caregiver if you should continue to take your regular prescribed and over-the-counter medicines.  Keep all follow-up appointments as directed by your caregiver. SEEK IMMEDIATE MEDICAL CARE IF:   You are unable to keep fluids down.  You do not urinate at least once every 6 to 8 hours.  You develop shortness of breath.  You notice blood in your stool or vomit. This may look like coffee grounds.  You have abdominal pain that increases or is concentrated in one small area (localized).  You have persistent vomiting or diarrhea.  You have a fever.  The patient is a child younger than 3 months, and he or she has a fever.  The patient is a child older than 3 months, and he or she has a fever and persistent symptoms.  The patient is a child older than 3 months, and he or she has a fever and symptoms suddenly get worse.  The patient is a  baby, and he or she has no tears when crying. MAKE SURE YOU:   Understand these instructions.  Will watch your condition.  Will get help right away if you are not doing well or get worse.   This information is not intended to replace advice given to you by your health care provider. Make sure you  discuss any questions you have with your health care provider.   Document Released: 03/07/2005 Document Revised: 05/30/2011 Document Reviewed: 12/22/2010 Elsevier Interactive Patient Education 2016 Elsevier Inc.  Pyelonephritis, Adult Pyelonephritis is a kidney infection. The kidneys are organs that help clean your blood by moving waste out of your blood and into your pee (urine). This infection can happen quickly, or it can last for a long time. In most cases, it clears up with treatment and does not cause other problems. HOME CARE Medicines  Take over-the-counter and prescription medicines only as told by your doctor.  Take your antibiotic medicine as told by your doctor. Do not stop taking the medicine even if you start to feel better. General Instructions  Drink enough fluid to keep your pee clear or pale yellow.  Avoid caffeine, tea, and carbonated drinks.  Pee (urinate) often. Avoid holding in pee for long periods of time.  Pee before and after sex.  After pooping (having a bowel movement), women should wipe from front to back. Use each tissue only once.  Keep all follow-up visits as told by your doctor. This is important. GET HELP IF:  You do not feel better after 2 days.  Your symptoms get worse.  You have a fever. GET HELP RIGHT AWAY IF:  You cannot take your medicine or drink fluids as told.  You have chills and shaking.  You throw up (vomit).  You have very bad pain in your side (flank) or back.  You feel very weak or you pass out (faint).   This information is not intended to replace advice given to you by your health care provider. Make sure you discuss any questions you have with your health care provider.   Document Released: 04/14/2004 Document Revised: 11/26/2014 Document Reviewed: 06/30/2014 Elsevier Interactive Patient Education [employer].

## 2015-07-10 LAB — FACTOR 5 LEIDEN

## 2015-07-10 LAB — PROTHROMBIN GENE MUTATION

## 2015-07-10 LAB — CULTURE, BLOOD (ROUTINE X 2)
CULTURE: NO GROWTH
Culture: NO GROWTH

## 2015-07-20 ENCOUNTER — Telehealth: Payer: Self-pay | Admitting: Behavioral Health

## 2015-07-20 NOTE — Telephone Encounter (Signed)
Unable to reach patient at time of Pre-Visit Call.  Left message for patient to return call when available.    

## 2015-07-21 ENCOUNTER — Ambulatory Visit (INDEPENDENT_AMBULATORY_CARE_PROVIDER_SITE_OTHER): Payer: Medicare Other | Admitting: Medical

## 2015-07-21 ENCOUNTER — Encounter: Payer: Self-pay | Admitting: Medical

## 2015-07-21 ENCOUNTER — Telehealth: Payer: Self-pay | Admitting: Medical

## 2015-07-21 VITALS — BP 116/76 | HR 80 | Temp 98.2°F | Ht 67.0 in | Wt 190.8 lb

## 2015-07-21 DIAGNOSIS — N280 Ischemia and infarction of kidney: Secondary | ICD-10-CM | POA: Diagnosis not present

## 2015-07-21 DIAGNOSIS — R5383 Other fatigue: Secondary | ICD-10-CM | POA: Diagnosis not present

## 2015-07-21 DIAGNOSIS — N12 Tubulo-interstitial nephritis, not specified as acute or chronic: Secondary | ICD-10-CM

## 2015-07-21 DIAGNOSIS — N179 Acute kidney failure, unspecified: Secondary | ICD-10-CM | POA: Diagnosis not present

## 2015-07-21 LAB — CBC WITH DIFFERENTIAL/PLATELET
BASOS PCT: 0.5 % (ref 0.0–3.0)
Basophils Absolute: 0 10*3/uL (ref 0.0–0.1)
EOS PCT: 2.8 % (ref 0.0–5.0)
Eosinophils Absolute: 0.2 10*3/uL (ref 0.0–0.7)
HEMATOCRIT: 40.2 % (ref 39.0–52.0)
HEMOGLOBIN: 13.2 g/dL (ref 13.0–17.0)
Lymphocytes Relative: 29.9 % (ref 12.0–46.0)
Lymphs Abs: 1.8 10*3/uL (ref 0.7–4.0)
MCHC: 32.9 g/dL (ref 30.0–36.0)
MCV: 89.5 fl (ref 78.0–100.0)
MONO ABS: 0.6 10*3/uL (ref 0.1–1.0)
Monocytes Relative: 9.5 % (ref 3.0–12.0)
NEUTROS ABS: 3.4 10*3/uL (ref 1.4–7.7)
Neutrophils Relative %: 57.3 % (ref 43.0–77.0)
PLATELETS: 384 10*3/uL (ref 150.0–400.0)
RBC: 4.49 Mil/uL (ref 4.22–5.81)
RDW: 13.6 % (ref 11.5–15.5)
WBC: 5.9 10*3/uL (ref 4.0–10.5)

## 2015-07-21 LAB — COMPREHENSIVE METABOLIC PANEL
ALBUMIN: 4 g/dL (ref 3.5–5.2)
ALT: 57 U/L — ABNORMAL HIGH (ref 0–53)
AST: 21 U/L (ref 0–37)
Alkaline Phosphatase: 77 U/L (ref 39–117)
BUN: 23 mg/dL (ref 6–23)
CALCIUM: 10 mg/dL (ref 8.4–10.5)
CHLORIDE: 100 meq/L (ref 96–112)
CO2: 27 meq/L (ref 19–32)
Creatinine, Ser: 1.71 mg/dL — ABNORMAL HIGH (ref 0.40–1.50)
GFR: 53.95 mL/min — ABNORMAL LOW (ref >60.00)
Glucose, Bld: 73 mg/dL (ref 70–99)
POTASSIUM: 4.2 meq/L (ref 3.5–5.1)
SODIUM: 135 meq/L (ref 135–145)
Total Bilirubin: 0.3 mg/dL (ref 0.2–1.2)
Total Protein: 7.4 g/dL (ref 6.0–8.3)

## 2015-07-21 LAB — POC URINALSYSI DIPSTICK (AUTOMATED)
Bilirubin, UA: NEGATIVE
Blood, UA: NEGATIVE
Glucose, UA: NEGATIVE
Ketones, UA: NEGATIVE
LEUKOCYTES UA: NEGATIVE
Nitrite, UA: NEGATIVE
PROTEIN UA: NEGATIVE
Spec Grav, UA: >=1.03
UROBILINOGEN UA: 0.2
pH, UA: 6

## 2015-07-21 NOTE — Telephone Encounter (Signed)
Pt states he will follow up with his pcp in [place]. He told me explicitly that is his desire. No intention of returning here due to inconvenience of drive. He states he has pcp in [place]. On discharge from hospital they set him up with me. So would you take me off as his  pcp.

## 2015-07-21 NOTE — Assessment & Plan Note (Signed)
Follow up from hospital. Will repeat UA and get cuture.

## 2015-07-21 NOTE — Assessment & Plan Note (Signed)
Follow up from hospital. Will recheck cmp today.

## 2015-07-21 NOTE — Progress Notes (Signed)
Pre visit review using our clinic review tool, if applicable. No additional management support is needed unless otherwise documented below in the visit note. 
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
## 2015-07-21 NOTE — Patient Instructions (Signed)
For your recent kidney infection and kidney injury we will get urine culture studies and lab work.  You expressed that you have pcp in [place] and want to follow up with them. So we will try to find out who that is. You indicate prescriber of your bp medication. We will try to send all results to them.  Follow up here as needed. But as you indicate follow up with your pcp in [place].

## 2015-07-22 LAB — URINE CULTURE
Colony Count: NO GROWTH
ORGANISM ID, BACTERIA: NO GROWTH

## 2015-07-22 NOTE — Telephone Encounter (Signed)
E. Saguier taken off as pcp per his request.

## 2015-07-23 ENCOUNTER — Telehealth: Payer: Self-pay | Admitting: Medical

## 2015-07-23 NOTE — Telephone Encounter (Signed)
Pt urine was clear. No growth. Please make sure you call his pcp office and arrange follow up as he stated he does not plan to change pcp. [facility] pcp is more convenient.

## 2015-07-23 NOTE — Telephone Encounter (Signed)
Left message for pt that his labs were normal and the records would be sent over to his primary in [place]. Pt was advised to call back with any questions.

## 2016-02-15 DIAGNOSIS — M797 Fibromyalgia: Secondary | ICD-10-CM | POA: Diagnosis not present

## 2016-02-15 DIAGNOSIS — R1084 Generalized abdominal pain: Secondary | ICD-10-CM | POA: Diagnosis not present

## 2016-02-15 DIAGNOSIS — D72829 Elevated white blood cell count, unspecified: Secondary | ICD-10-CM | POA: Diagnosis not present

## 2016-02-15 DIAGNOSIS — K852 Alcohol induced acute pancreatitis without necrosis or infection: Secondary | ICD-10-CM | POA: Diagnosis not present

## 2016-02-15 DIAGNOSIS — I10 Essential (primary) hypertension: Secondary | ICD-10-CM | POA: Diagnosis not present

## 2016-02-15 DIAGNOSIS — R748 Abnormal levels of other serum enzymes: Secondary | ICD-10-CM | POA: Diagnosis not present

## 2016-02-22 DIAGNOSIS — Z8719 Personal history of other diseases of the digestive system: Secondary | ICD-10-CM | POA: Diagnosis not present

## 2016-02-22 DIAGNOSIS — M791 Myalgia: Secondary | ICD-10-CM | POA: Diagnosis not present

## 2016-02-22 DIAGNOSIS — R1013 Epigastric pain: Secondary | ICD-10-CM | POA: Diagnosis not present

## 2016-02-22 DIAGNOSIS — I959 Hypotension, unspecified: Secondary | ICD-10-CM | POA: Diagnosis not present

## 2016-02-24 DIAGNOSIS — I10 Essential (primary) hypertension: Secondary | ICD-10-CM | POA: Diagnosis not present

## 2016-02-24 DIAGNOSIS — K859 Acute pancreatitis without necrosis or infection, unspecified: Secondary | ICD-10-CM | POA: Diagnosis not present

## 2016-02-24 DIAGNOSIS — K8500 Idiopathic acute pancreatitis without necrosis or infection: Secondary | ICD-10-CM | POA: Diagnosis not present

## 2016-02-24 DIAGNOSIS — Z79899 Other long term (current) drug therapy: Secondary | ICD-10-CM | POA: Diagnosis not present

## 2016-03-25 DIAGNOSIS — M79604 Pain in right leg: Secondary | ICD-10-CM | POA: Diagnosis not present

## 2016-03-25 DIAGNOSIS — M5137 Other intervertebral disc degeneration, lumbosacral region: Secondary | ICD-10-CM | POA: Diagnosis not present

## 2016-03-25 DIAGNOSIS — F329 Major depressive disorder, single episode, unspecified: Secondary | ICD-10-CM | POA: Diagnosis not present

## 2016-03-25 DIAGNOSIS — I10 Essential (primary) hypertension: Secondary | ICD-10-CM | POA: Diagnosis not present

## 2016-03-25 DIAGNOSIS — K859 Acute pancreatitis without necrosis or infection, unspecified: Secondary | ICD-10-CM | POA: Diagnosis not present

## 2016-03-25 DIAGNOSIS — M5116 Intervertebral disc disorders with radiculopathy, lumbar region: Secondary | ICD-10-CM | POA: Diagnosis not present

## 2016-03-25 DIAGNOSIS — M62830 Muscle spasm of back: Secondary | ICD-10-CM | POA: Diagnosis not present

## 2016-04-08 DIAGNOSIS — M79671 Pain in right foot: Secondary | ICD-10-CM | POA: Diagnosis not present

## 2016-04-08 DIAGNOSIS — Z23 Encounter for immunization: Secondary | ICD-10-CM | POA: Diagnosis not present

## 2016-04-08 DIAGNOSIS — G8929 Other chronic pain: Secondary | ICD-10-CM | POA: Diagnosis not present

## 2016-04-08 DIAGNOSIS — R7303 Prediabetes: Secondary | ICD-10-CM | POA: Diagnosis not present

## 2016-04-08 DIAGNOSIS — I10 Essential (primary) hypertension: Secondary | ICD-10-CM | POA: Diagnosis not present

## 2016-04-08 DIAGNOSIS — G629 Polyneuropathy, unspecified: Secondary | ICD-10-CM | POA: Diagnosis not present

## 2016-04-27 DIAGNOSIS — M79671 Pain in right foot: Secondary | ICD-10-CM | POA: Diagnosis not present

## 2016-04-27 DIAGNOSIS — Z7409 Other reduced mobility: Secondary | ICD-10-CM | POA: Diagnosis not present

## 2016-04-27 DIAGNOSIS — R29898 Other symptoms and signs involving the musculoskeletal system: Secondary | ICD-10-CM | POA: Diagnosis not present

## 2016-05-02 DIAGNOSIS — Z7409 Other reduced mobility: Secondary | ICD-10-CM | POA: Diagnosis not present

## 2016-05-02 DIAGNOSIS — M79671 Pain in right foot: Secondary | ICD-10-CM | POA: Diagnosis not present

## 2016-05-02 DIAGNOSIS — R29898 Other symptoms and signs involving the musculoskeletal system: Secondary | ICD-10-CM | POA: Diagnosis not present

## 2016-05-09 DIAGNOSIS — R29898 Other symptoms and signs involving the musculoskeletal system: Secondary | ICD-10-CM | POA: Diagnosis not present

## 2016-05-09 DIAGNOSIS — M79671 Pain in right foot: Secondary | ICD-10-CM | POA: Diagnosis not present

## 2016-05-09 DIAGNOSIS — Z7409 Other reduced mobility: Secondary | ICD-10-CM | POA: Diagnosis not present

## 2016-05-12 DIAGNOSIS — M19071 Primary osteoarthritis, right ankle and foot: Secondary | ICD-10-CM | POA: Diagnosis not present

## 2016-05-18 DIAGNOSIS — Z7409 Other reduced mobility: Secondary | ICD-10-CM | POA: Diagnosis not present

## 2016-05-18 DIAGNOSIS — R29898 Other symptoms and signs involving the musculoskeletal system: Secondary | ICD-10-CM | POA: Diagnosis not present

## 2016-05-18 DIAGNOSIS — M79671 Pain in right foot: Secondary | ICD-10-CM | POA: Diagnosis not present

## 2016-05-25 DIAGNOSIS — R29898 Other symptoms and signs involving the musculoskeletal system: Secondary | ICD-10-CM | POA: Diagnosis not present

## 2016-05-25 DIAGNOSIS — M79671 Pain in right foot: Secondary | ICD-10-CM | POA: Diagnosis not present

## 2016-05-25 DIAGNOSIS — Z7409 Other reduced mobility: Secondary | ICD-10-CM | POA: Diagnosis not present

## 2016-05-30 DIAGNOSIS — R29898 Other symptoms and signs involving the musculoskeletal system: Secondary | ICD-10-CM | POA: Diagnosis not present

## 2016-05-30 DIAGNOSIS — Z7409 Other reduced mobility: Secondary | ICD-10-CM | POA: Diagnosis not present

## 2016-05-30 DIAGNOSIS — M79671 Pain in right foot: Secondary | ICD-10-CM | POA: Diagnosis not present

## 2016-06-13 DIAGNOSIS — M79671 Pain in right foot: Secondary | ICD-10-CM | POA: Diagnosis not present

## 2016-06-13 DIAGNOSIS — R29898 Other symptoms and signs involving the musculoskeletal system: Secondary | ICD-10-CM | POA: Diagnosis not present

## 2016-06-13 DIAGNOSIS — Z7409 Other reduced mobility: Secondary | ICD-10-CM | POA: Diagnosis not present

## 2016-06-22 DIAGNOSIS — M79671 Pain in right foot: Secondary | ICD-10-CM | POA: Diagnosis not present

## 2016-06-22 DIAGNOSIS — R29898 Other symptoms and signs involving the musculoskeletal system: Secondary | ICD-10-CM | POA: Diagnosis not present

## 2016-06-22 DIAGNOSIS — Z7409 Other reduced mobility: Secondary | ICD-10-CM | POA: Diagnosis not present

## 2016-06-27 DIAGNOSIS — Z7409 Other reduced mobility: Secondary | ICD-10-CM | POA: Diagnosis not present

## 2016-06-27 DIAGNOSIS — R29898 Other symptoms and signs involving the musculoskeletal system: Secondary | ICD-10-CM | POA: Diagnosis not present

## 2016-06-27 DIAGNOSIS — M79671 Pain in right foot: Secondary | ICD-10-CM | POA: Diagnosis not present

## 2016-07-01 DIAGNOSIS — M62838 Other muscle spasm: Secondary | ICD-10-CM | POA: Diagnosis not present

## 2016-07-01 DIAGNOSIS — F329 Major depressive disorder, single episode, unspecified: Secondary | ICD-10-CM | POA: Diagnosis not present

## 2016-07-01 DIAGNOSIS — I10 Essential (primary) hypertension: Secondary | ICD-10-CM | POA: Diagnosis not present

## 2016-07-01 DIAGNOSIS — M79604 Pain in right leg: Secondary | ICD-10-CM | POA: Diagnosis not present

## 2016-07-01 DIAGNOSIS — Z79899 Other long term (current) drug therapy: Secondary | ICD-10-CM | POA: Diagnosis not present

## 2016-07-04 DIAGNOSIS — R29898 Other symptoms and signs involving the musculoskeletal system: Secondary | ICD-10-CM | POA: Diagnosis not present

## 2016-07-04 DIAGNOSIS — M79671 Pain in right foot: Secondary | ICD-10-CM | POA: Diagnosis not present

## 2016-07-04 DIAGNOSIS — Z7409 Other reduced mobility: Secondary | ICD-10-CM | POA: Diagnosis not present

## 2016-07-15 DIAGNOSIS — Z8659 Personal history of other mental and behavioral disorders: Secondary | ICD-10-CM | POA: Diagnosis not present

## 2016-07-15 DIAGNOSIS — M62838 Other muscle spasm: Secondary | ICD-10-CM | POA: Diagnosis not present

## 2016-07-15 DIAGNOSIS — Z9119 Patient's noncompliance with other medical treatment and regimen: Secondary | ICD-10-CM | POA: Diagnosis not present

## 2016-07-15 DIAGNOSIS — M79661 Pain in right lower leg: Secondary | ICD-10-CM | POA: Diagnosis not present

## 2016-07-15 DIAGNOSIS — Z79899 Other long term (current) drug therapy: Secondary | ICD-10-CM | POA: Diagnosis not present

## 2016-07-15 DIAGNOSIS — F329 Major depressive disorder, single episode, unspecified: Secondary | ICD-10-CM | POA: Diagnosis not present

## 2016-07-15 DIAGNOSIS — I10 Essential (primary) hypertension: Secondary | ICD-10-CM | POA: Diagnosis not present

## 2016-08-04 DIAGNOSIS — I10 Essential (primary) hypertension: Secondary | ICD-10-CM | POA: Diagnosis not present

## 2016-08-05 DIAGNOSIS — Q667 Congenital pes cavus: Secondary | ICD-10-CM | POA: Diagnosis not present

## 2016-08-05 DIAGNOSIS — M7671 Peroneal tendinitis, right leg: Secondary | ICD-10-CM | POA: Diagnosis not present

## 2016-08-05 DIAGNOSIS — M722 Plantar fascial fibromatosis: Secondary | ICD-10-CM | POA: Diagnosis not present

## 2016-08-20 IMAGING — CT CT ABD-PELV W/ CM
2 of 5 series · 16 of 46 positions shown, 18 images · IV contrast (APPLIED)
Comparison: None.

CLINICAL DATA: Cramping abdominal pain, onset 3 days ago.

EXAM:
CT ABDOMEN AND PELVIS WITH CONTRAST
TECHNIQUE: Multidetector CT imaging of the abdomen and pelvis was performed
using the standard protocol following bolus administration of
intravenous contrast.
CONTRAST:  100mL [id number] IOPAMIDOL ([id number]) INJECTION 61%

[Series 2: axial st · axial · 0.84mm/px · z∈[+327,+762]mm · 13 of 97 slices shown, 15 images]
[im 5/97  soft-tissue]
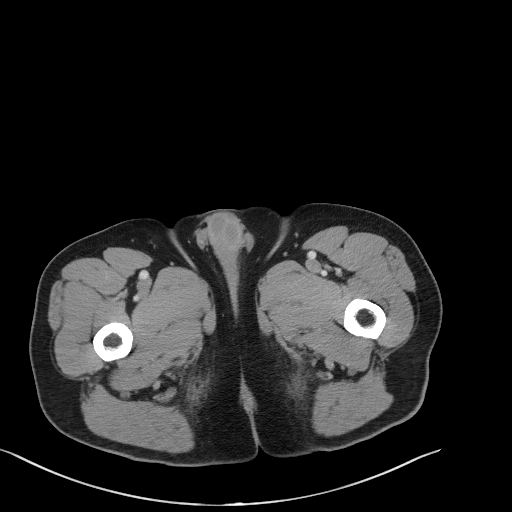
[im 5/97  bone]
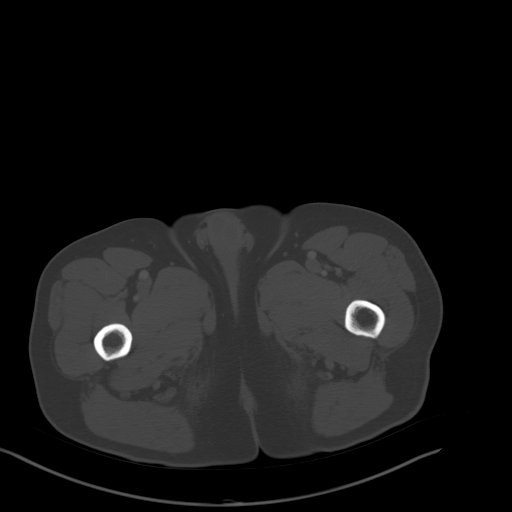
[im 14/97  soft-tissue]
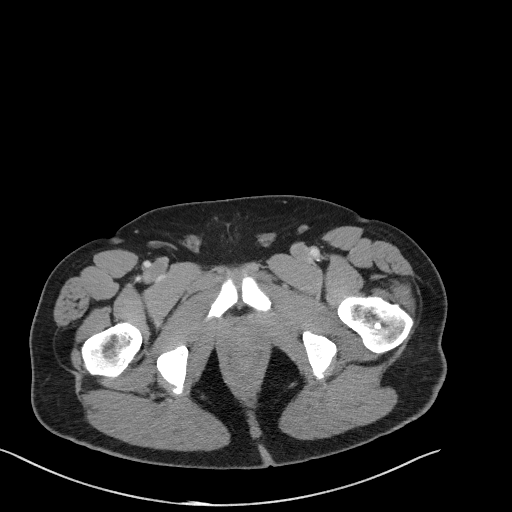
[im 19/97  soft-tissue]
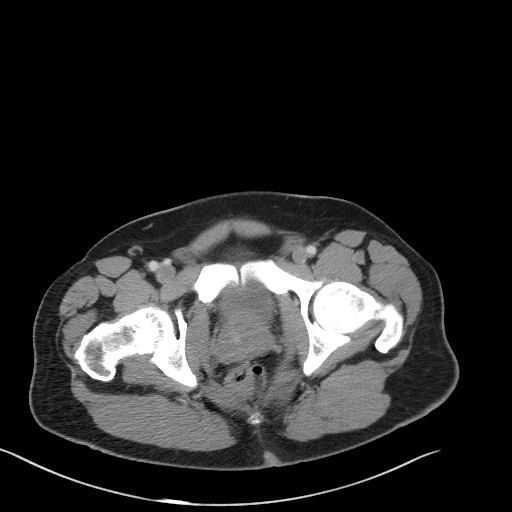
[im 28/97  soft-tissue]
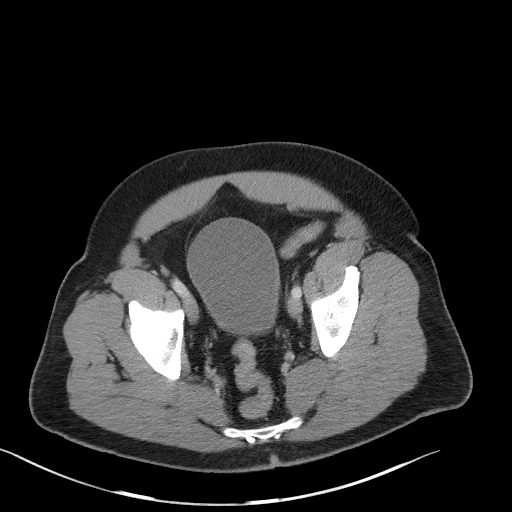
[im 33/97  soft-tissue]
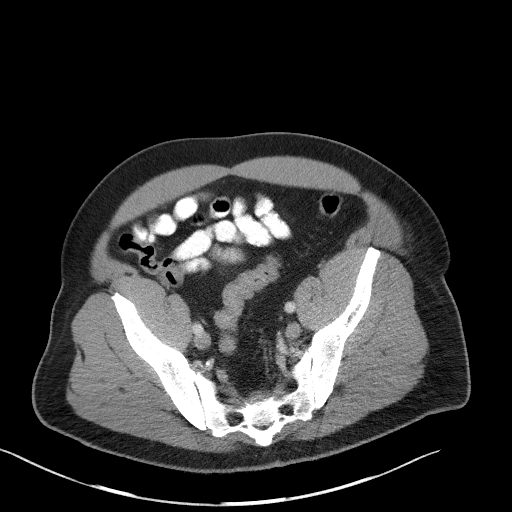
[im 42/97  soft-tissue]
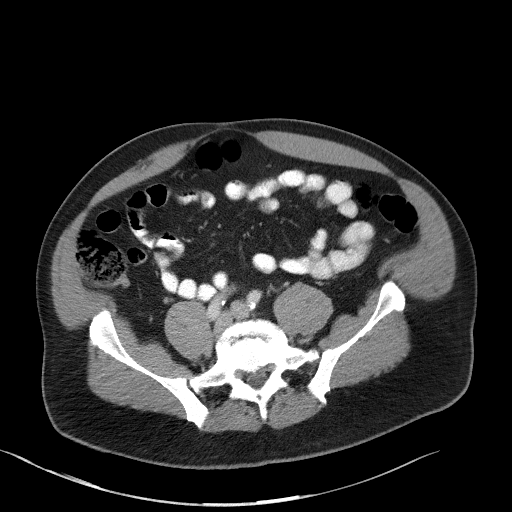
[im 51/97  soft-tissue]
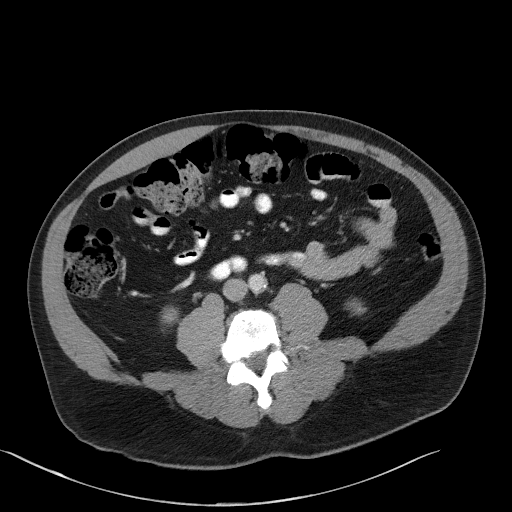
[im 55/97  soft-tissue]
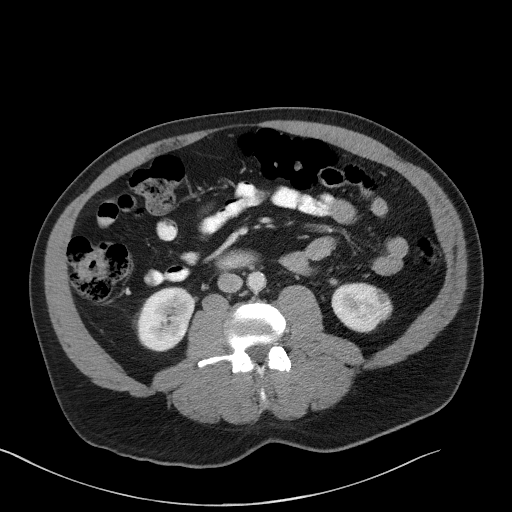
[im 65/97  soft-tissue]
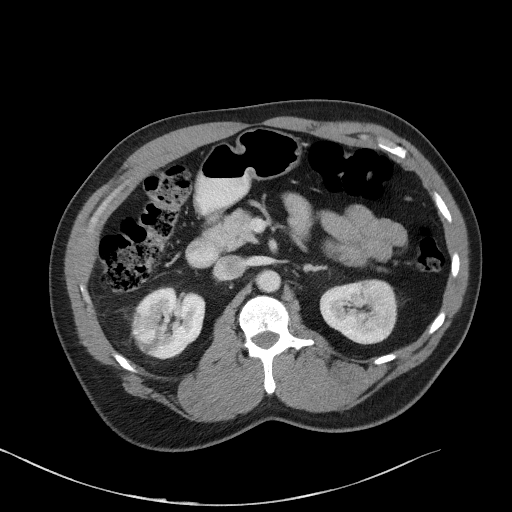
[im 65/97  bone]
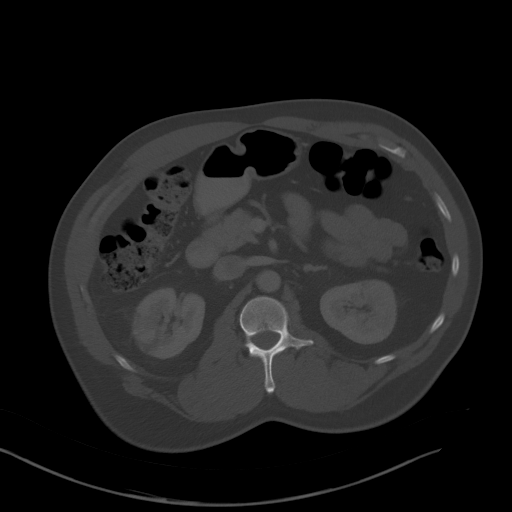
[im 69/97  soft-tissue]
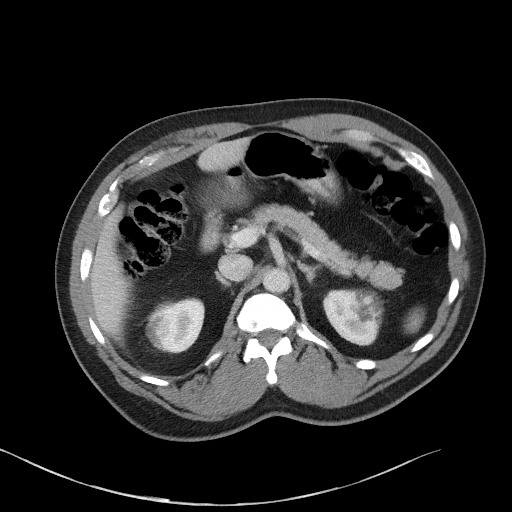
[im 78/97  soft-tissue]
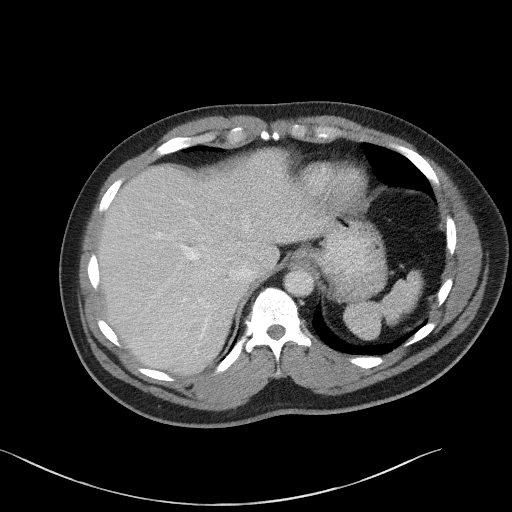
[im 83/97  soft-tissue]
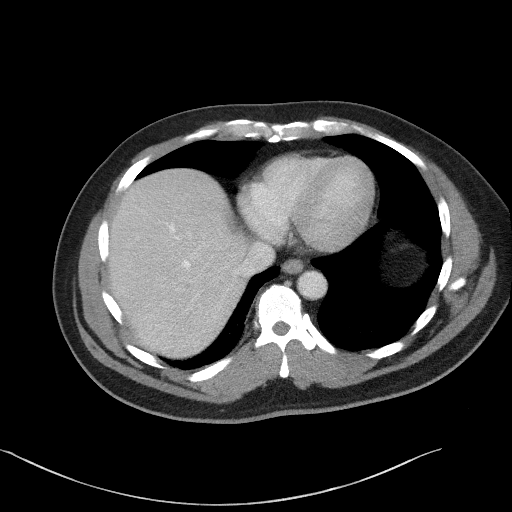
[im 92/97  soft-tissue]
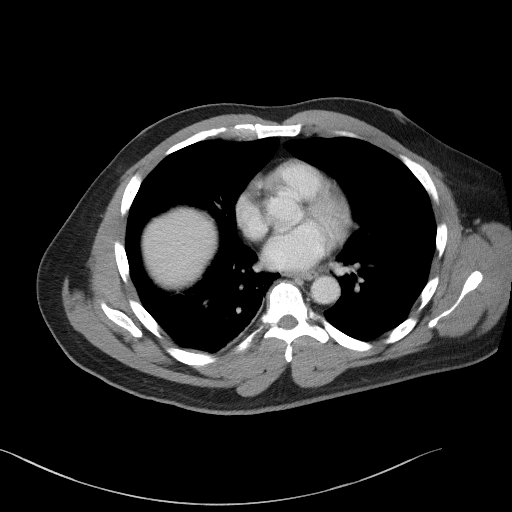

[Series 5: coronal st · coronal · 0.81mm/px · 3 of 101 slices shown]
[im 34/101  soft-tissue]
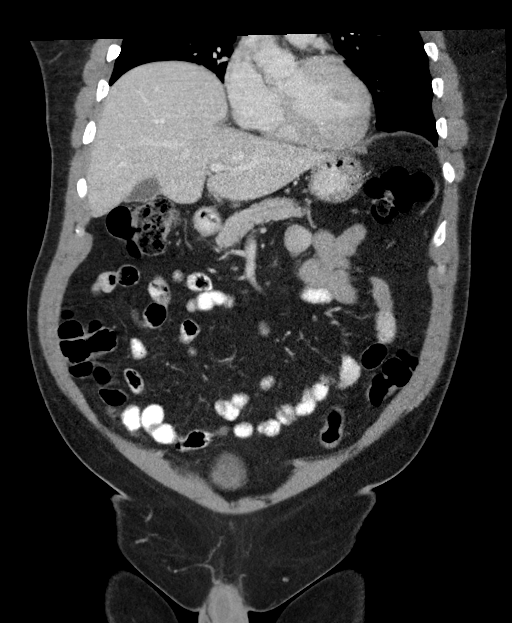
[im 45/101  soft-tissue]
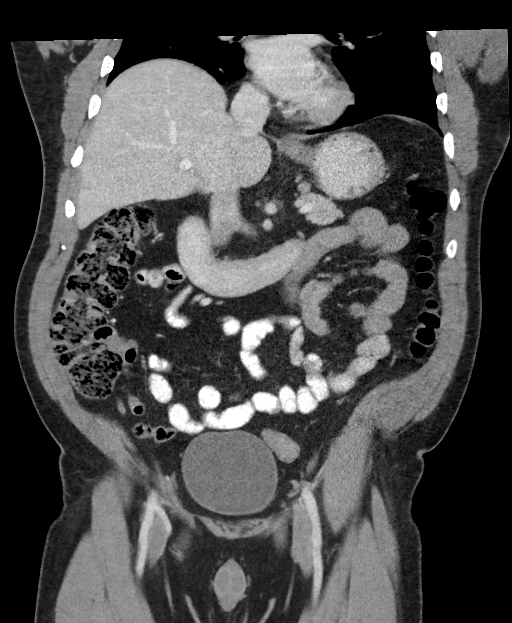
[im 56/101  soft-tissue]
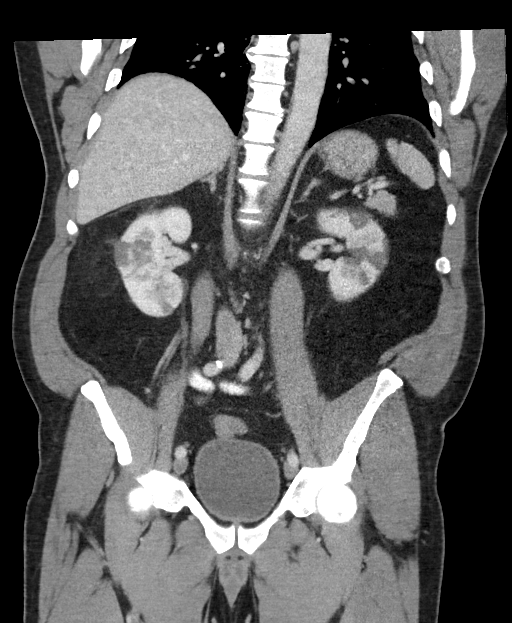

[16 of 46 positions shown; findings below may reference images not displayed]

FINDINGS: Lower chest:  No significant abnormality

Hepatobiliary: There are normal appearances of the liver,
gallbladder and bile ducts.

Pancreas: Normal

Spleen: Normal

Adrenals/Urinary Tract: Both adrenals are normal. There are multiple
wedge-shaped low-attenuation abnormalities in both kidneys,
involving cortex and medulla. This may represent pyelonephritis.
Renal infarctions could also produce this appearance. No discrete
renal mass is evident. No obstructive uropathy. The ureters and
urinary bladder are unremarkable.

Stomach/Bowel: There are normal appearances of the stomach, small
bowel and colon. The appendix is normal.

Vascular/Lymphatic: The abdominal aorta is normal in caliber. There
is mild atherosclerotic calcification. There is no adenopathy in the
abdomen or pelvis.

Reproductive: Unremarkable

Other: No ascites.

Musculoskeletal: No significant skeletal lesion. Moderately severe
degenerative lumbar disc disease is present at L4 through the
sacrum.
IMPRESSION: Both kidneys are abnormal, with multiple wedge-shaped peripherally
based areas of hypodensity. This may represent bilateral
pyelonephritis. Renal infarctions may be considered as a less likely
possibility.

## 2016-09-20 DIAGNOSIS — M7731 Calcaneal spur, right foot: Secondary | ICD-10-CM | POA: Diagnosis not present

## 2016-09-20 DIAGNOSIS — M722 Plantar fascial fibromatosis: Secondary | ICD-10-CM | POA: Diagnosis not present

## 2016-09-22 DIAGNOSIS — R293 Abnormal posture: Secondary | ICD-10-CM | POA: Diagnosis not present

## 2016-09-22 DIAGNOSIS — G8929 Other chronic pain: Secondary | ICD-10-CM | POA: Diagnosis not present

## 2016-09-22 DIAGNOSIS — R29898 Other symptoms and signs involving the musculoskeletal system: Secondary | ICD-10-CM | POA: Diagnosis not present

## 2016-09-22 DIAGNOSIS — M542 Cervicalgia: Secondary | ICD-10-CM | POA: Diagnosis not present

## 2016-09-22 DIAGNOSIS — M436 Torticollis: Secondary | ICD-10-CM | POA: Diagnosis not present

## 2016-09-30 DIAGNOSIS — R29898 Other symptoms and signs involving the musculoskeletal system: Secondary | ICD-10-CM | POA: Diagnosis not present

## 2016-09-30 DIAGNOSIS — R293 Abnormal posture: Secondary | ICD-10-CM | POA: Diagnosis not present

## 2016-09-30 DIAGNOSIS — M436 Torticollis: Secondary | ICD-10-CM | POA: Diagnosis not present

## 2016-09-30 DIAGNOSIS — M542 Cervicalgia: Secondary | ICD-10-CM | POA: Diagnosis not present

## 2016-09-30 DIAGNOSIS — G8929 Other chronic pain: Secondary | ICD-10-CM | POA: Diagnosis not present

## 2016-10-13 DIAGNOSIS — M542 Cervicalgia: Secondary | ICD-10-CM | POA: Diagnosis not present

## 2016-10-13 DIAGNOSIS — R293 Abnormal posture: Secondary | ICD-10-CM | POA: Diagnosis not present

## 2016-10-13 DIAGNOSIS — G8929 Other chronic pain: Secondary | ICD-10-CM | POA: Diagnosis not present

## 2016-10-13 DIAGNOSIS — M436 Torticollis: Secondary | ICD-10-CM | POA: Diagnosis not present

## 2016-10-13 DIAGNOSIS — R29898 Other symptoms and signs involving the musculoskeletal system: Secondary | ICD-10-CM | POA: Diagnosis not present

## 2016-10-26 DIAGNOSIS — M436 Torticollis: Secondary | ICD-10-CM | POA: Diagnosis not present

## 2016-10-26 DIAGNOSIS — G8929 Other chronic pain: Secondary | ICD-10-CM | POA: Diagnosis not present

## 2016-10-26 DIAGNOSIS — R293 Abnormal posture: Secondary | ICD-10-CM | POA: Diagnosis not present

## 2016-10-26 DIAGNOSIS — M542 Cervicalgia: Secondary | ICD-10-CM | POA: Diagnosis not present

## 2016-11-02 DIAGNOSIS — M542 Cervicalgia: Secondary | ICD-10-CM | POA: Diagnosis not present

## 2016-11-02 DIAGNOSIS — G8929 Other chronic pain: Secondary | ICD-10-CM | POA: Diagnosis not present

## 2016-11-02 DIAGNOSIS — R293 Abnormal posture: Secondary | ICD-10-CM | POA: Diagnosis not present

## 2016-11-02 DIAGNOSIS — M436 Torticollis: Secondary | ICD-10-CM | POA: Diagnosis not present

## 2016-11-04 DIAGNOSIS — L299 Pruritus, unspecified: Secondary | ICD-10-CM | POA: Diagnosis not present

## 2016-11-04 DIAGNOSIS — H938X2 Other specified disorders of left ear: Secondary | ICD-10-CM | POA: Diagnosis not present

## 2016-11-04 DIAGNOSIS — T63481A Toxic effect of venom of other arthropod, accidental (unintentional), initial encounter: Secondary | ICD-10-CM | POA: Diagnosis not present

## 2016-11-09 DIAGNOSIS — M436 Torticollis: Secondary | ICD-10-CM | POA: Diagnosis not present

## 2016-11-09 DIAGNOSIS — M542 Cervicalgia: Secondary | ICD-10-CM | POA: Diagnosis not present

## 2016-11-09 DIAGNOSIS — R293 Abnormal posture: Secondary | ICD-10-CM | POA: Diagnosis not present

## 2016-11-09 DIAGNOSIS — G8929 Other chronic pain: Secondary | ICD-10-CM | POA: Diagnosis not present

## 2016-11-24 DIAGNOSIS — M21621 Bunionette of right foot: Secondary | ICD-10-CM | POA: Diagnosis not present

## 2016-12-16 DIAGNOSIS — M25552 Pain in left hip: Secondary | ICD-10-CM | POA: Diagnosis not present

## 2016-12-16 DIAGNOSIS — N529 Male erectile dysfunction, unspecified: Secondary | ICD-10-CM | POA: Diagnosis not present

## 2016-12-16 DIAGNOSIS — I10 Essential (primary) hypertension: Secondary | ICD-10-CM | POA: Diagnosis not present

## 2016-12-16 DIAGNOSIS — Z23 Encounter for immunization: Secondary | ICD-10-CM | POA: Diagnosis not present

## 2016-12-16 DIAGNOSIS — L659 Nonscarring hair loss, unspecified: Secondary | ICD-10-CM | POA: Diagnosis not present

## 2016-12-16 DIAGNOSIS — Z0000 Encounter for general adult medical examination without abnormal findings: Secondary | ICD-10-CM | POA: Diagnosis not present

## 2017-01-02 DIAGNOSIS — M25652 Stiffness of left hip, not elsewhere classified: Secondary | ICD-10-CM | POA: Diagnosis not present

## 2017-01-02 DIAGNOSIS — M25552 Pain in left hip: Secondary | ICD-10-CM | POA: Diagnosis not present

## 2017-01-02 DIAGNOSIS — R262 Difficulty in walking, not elsewhere classified: Secondary | ICD-10-CM | POA: Diagnosis not present

## 2017-01-10 DIAGNOSIS — R262 Difficulty in walking, not elsewhere classified: Secondary | ICD-10-CM | POA: Diagnosis not present

## 2017-01-10 DIAGNOSIS — M25652 Stiffness of left hip, not elsewhere classified: Secondary | ICD-10-CM | POA: Diagnosis not present

## 2017-01-10 DIAGNOSIS — M25552 Pain in left hip: Secondary | ICD-10-CM | POA: Diagnosis not present

## 2017-01-12 DIAGNOSIS — M722 Plantar fascial fibromatosis: Secondary | ICD-10-CM | POA: Diagnosis not present

## 2017-01-18 DIAGNOSIS — R262 Difficulty in walking, not elsewhere classified: Secondary | ICD-10-CM | POA: Diagnosis not present

## 2017-01-18 DIAGNOSIS — M25552 Pain in left hip: Secondary | ICD-10-CM | POA: Diagnosis not present

## 2017-01-18 DIAGNOSIS — M25652 Stiffness of left hip, not elsewhere classified: Secondary | ICD-10-CM | POA: Diagnosis not present

## 2017-02-01 DIAGNOSIS — R262 Difficulty in walking, not elsewhere classified: Secondary | ICD-10-CM | POA: Diagnosis not present

## 2017-02-01 DIAGNOSIS — M25552 Pain in left hip: Secondary | ICD-10-CM | POA: Diagnosis not present

## 2017-02-01 DIAGNOSIS — G8929 Other chronic pain: Secondary | ICD-10-CM | POA: Diagnosis not present

## 2017-02-01 DIAGNOSIS — M25652 Stiffness of left hip, not elsewhere classified: Secondary | ICD-10-CM | POA: Diagnosis not present

## 2017-02-01 DIAGNOSIS — M436 Torticollis: Secondary | ICD-10-CM | POA: Diagnosis not present

## 2017-02-01 DIAGNOSIS — M542 Cervicalgia: Secondary | ICD-10-CM | POA: Diagnosis not present

## 2017-02-14 DIAGNOSIS — G8929 Other chronic pain: Secondary | ICD-10-CM | POA: Diagnosis not present

## 2017-02-14 DIAGNOSIS — R262 Difficulty in walking, not elsewhere classified: Secondary | ICD-10-CM | POA: Diagnosis not present

## 2017-02-14 DIAGNOSIS — M542 Cervicalgia: Secondary | ICD-10-CM | POA: Diagnosis not present

## 2017-02-14 DIAGNOSIS — M25652 Stiffness of left hip, not elsewhere classified: Secondary | ICD-10-CM | POA: Diagnosis not present

## 2017-02-14 DIAGNOSIS — M436 Torticollis: Secondary | ICD-10-CM | POA: Diagnosis not present

## 2017-02-14 DIAGNOSIS — M25552 Pain in left hip: Secondary | ICD-10-CM | POA: Diagnosis not present

## 2017-02-22 DIAGNOSIS — L03031 Cellulitis of right toe: Secondary | ICD-10-CM | POA: Diagnosis not present

## 2017-02-22 DIAGNOSIS — B351 Tinea unguium: Secondary | ICD-10-CM | POA: Diagnosis not present

## 2017-02-23 DIAGNOSIS — M25652 Stiffness of left hip, not elsewhere classified: Secondary | ICD-10-CM | POA: Diagnosis not present

## 2017-02-23 DIAGNOSIS — M25552 Pain in left hip: Secondary | ICD-10-CM | POA: Diagnosis not present

## 2017-02-23 DIAGNOSIS — R262 Difficulty in walking, not elsewhere classified: Secondary | ICD-10-CM | POA: Diagnosis not present

## 2022-05-26 DIAGNOSIS — Z0000 Encounter for general adult medical examination without abnormal findings: Secondary | ICD-10-CM | POA: Diagnosis not present

## 2022-05-26 DIAGNOSIS — R7303 Prediabetes: Secondary | ICD-10-CM | POA: Diagnosis not present

## 2022-05-26 DIAGNOSIS — I739 Peripheral vascular disease, unspecified: Secondary | ICD-10-CM | POA: Diagnosis not present

## 2022-05-26 DIAGNOSIS — M1612 Unilateral primary osteoarthritis, left hip: Secondary | ICD-10-CM | POA: Diagnosis not present

## 2022-05-26 DIAGNOSIS — Z13220 Encounter for screening for lipoid disorders: Secondary | ICD-10-CM | POA: Diagnosis not present

## 2022-05-26 DIAGNOSIS — E785 Hyperlipidemia, unspecified: Secondary | ICD-10-CM | POA: Diagnosis not present

## 2022-05-26 DIAGNOSIS — N529 Male erectile dysfunction, unspecified: Secondary | ICD-10-CM | POA: Diagnosis not present

## 2022-05-26 DIAGNOSIS — I10 Essential (primary) hypertension: Secondary | ICD-10-CM | POA: Diagnosis not present

## 2022-06-08 DIAGNOSIS — M1612 Unilateral primary osteoarthritis, left hip: Secondary | ICD-10-CM | POA: Diagnosis not present

## 2022-06-08 DIAGNOSIS — Z0000 Encounter for general adult medical examination without abnormal findings: Secondary | ICD-10-CM | POA: Diagnosis not present

## 2022-06-10 DIAGNOSIS — H2513 Age-related nuclear cataract, bilateral: Secondary | ICD-10-CM | POA: Diagnosis not present

## 2022-06-10 DIAGNOSIS — H524 Presbyopia: Secondary | ICD-10-CM | POA: Diagnosis not present

## 2022-06-20 DIAGNOSIS — M1612 Unilateral primary osteoarthritis, left hip: Secondary | ICD-10-CM | POA: Diagnosis not present

## 2022-06-20 DIAGNOSIS — M25552 Pain in left hip: Secondary | ICD-10-CM | POA: Diagnosis not present

## 2022-06-30 DIAGNOSIS — M21621 Bunionette of right foot: Secondary | ICD-10-CM | POA: Diagnosis not present

## 2022-08-01 DIAGNOSIS — H527 Unspecified disorder of refraction: Secondary | ICD-10-CM | POA: Diagnosis not present

## 2022-08-01 DIAGNOSIS — S0592XA Unspecified injury of left eye and orbit, initial encounter: Secondary | ICD-10-CM | POA: Diagnosis not present

## 2022-08-01 DIAGNOSIS — S0590XA Unspecified injury of unspecified eye and orbit, initial encounter: Secondary | ICD-10-CM | POA: Diagnosis not present

## 2022-08-01 DIAGNOSIS — H2513 Age-related nuclear cataract, bilateral: Secondary | ICD-10-CM | POA: Diagnosis not present

## 2022-09-01 DIAGNOSIS — L649 Androgenic alopecia, unspecified: Secondary | ICD-10-CM | POA: Diagnosis not present

## 2022-09-29 DIAGNOSIS — M722 Plantar fascial fibromatosis: Secondary | ICD-10-CM | POA: Diagnosis not present

## 2022-12-14 DIAGNOSIS — M1611 Unilateral primary osteoarthritis, right hip: Secondary | ICD-10-CM | POA: Diagnosis not present

## 2022-12-14 DIAGNOSIS — N529 Male erectile dysfunction, unspecified: Secondary | ICD-10-CM | POA: Diagnosis not present

## 2022-12-14 DIAGNOSIS — M1612 Unilateral primary osteoarthritis, left hip: Secondary | ICD-10-CM | POA: Diagnosis not present

## 2022-12-14 DIAGNOSIS — I10 Essential (primary) hypertension: Secondary | ICD-10-CM | POA: Diagnosis not present

## 2022-12-23 DIAGNOSIS — N401 Enlarged prostate with lower urinary tract symptoms: Secondary | ICD-10-CM | POA: Diagnosis not present

## 2022-12-23 DIAGNOSIS — N529 Male erectile dysfunction, unspecified: Secondary | ICD-10-CM | POA: Diagnosis not present

## 2023-01-10 DIAGNOSIS — S91102D Unspecified open wound of left great toe without damage to nail, subsequent encounter: Secondary | ICD-10-CM | POA: Diagnosis not present

## 2023-01-10 DIAGNOSIS — S91101D Unspecified open wound of right great toe without damage to nail, subsequent encounter: Secondary | ICD-10-CM | POA: Diagnosis not present

## 2023-01-20 DIAGNOSIS — N50819 Testicular pain, unspecified: Secondary | ICD-10-CM | POA: Diagnosis not present

## 2023-01-23 DIAGNOSIS — N50819 Testicular pain, unspecified: Secondary | ICD-10-CM | POA: Diagnosis not present

## 2023-01-27 DIAGNOSIS — M1612 Unilateral primary osteoarthritis, left hip: Secondary | ICD-10-CM | POA: Diagnosis not present

## 2023-01-27 DIAGNOSIS — M25552 Pain in left hip: Secondary | ICD-10-CM | POA: Diagnosis not present

## 2023-01-27 DIAGNOSIS — M25561 Pain in right knee: Secondary | ICD-10-CM | POA: Diagnosis not present

## 2023-03-08 DIAGNOSIS — H538 Other visual disturbances: Secondary | ICD-10-CM | POA: Diagnosis not present

## 2023-03-08 DIAGNOSIS — I10 Essential (primary) hypertension: Secondary | ICD-10-CM | POA: Diagnosis not present

## 2023-03-08 DIAGNOSIS — N529 Male erectile dysfunction, unspecified: Secondary | ICD-10-CM | POA: Diagnosis not present

## 2023-03-27 DIAGNOSIS — Z95820 Peripheral vascular angioplasty status with implants and grafts: Secondary | ICD-10-CM | POA: Diagnosis not present

## 2023-03-27 DIAGNOSIS — I739 Peripheral vascular disease, unspecified: Secondary | ICD-10-CM | POA: Diagnosis not present

## 2023-03-30 DIAGNOSIS — M1612 Unilateral primary osteoarthritis, left hip: Secondary | ICD-10-CM | POA: Diagnosis not present

## 2023-04-13 DIAGNOSIS — M722 Plantar fascial fibromatosis: Secondary | ICD-10-CM | POA: Diagnosis not present

## 2023-04-21 DIAGNOSIS — L649 Androgenic alopecia, unspecified: Secondary | ICD-10-CM | POA: Diagnosis not present

## 2023-05-03 DIAGNOSIS — I10 Essential (primary) hypertension: Secondary | ICD-10-CM | POA: Diagnosis not present

## 2023-05-03 DIAGNOSIS — I739 Peripheral vascular disease, unspecified: Secondary | ICD-10-CM | POA: Diagnosis not present

## 2023-05-03 DIAGNOSIS — E785 Hyperlipidemia, unspecified: Secondary | ICD-10-CM | POA: Diagnosis not present

## 2023-05-03 DIAGNOSIS — R7303 Prediabetes: Secondary | ICD-10-CM | POA: Diagnosis not present

## 2023-05-08 DIAGNOSIS — Z7409 Other reduced mobility: Secondary | ICD-10-CM | POA: Diagnosis not present

## 2023-05-08 DIAGNOSIS — M25652 Stiffness of left hip, not elsewhere classified: Secondary | ICD-10-CM | POA: Diagnosis not present

## 2023-05-08 DIAGNOSIS — M1612 Unilateral primary osteoarthritis, left hip: Secondary | ICD-10-CM | POA: Diagnosis not present

## 2023-05-08 DIAGNOSIS — R29898 Other symptoms and signs involving the musculoskeletal system: Secondary | ICD-10-CM | POA: Diagnosis not present

## 2023-05-23 DIAGNOSIS — I10 Essential (primary) hypertension: Secondary | ICD-10-CM | POA: Diagnosis not present

## 2023-05-23 DIAGNOSIS — M1612 Unilateral primary osteoarthritis, left hip: Secondary | ICD-10-CM | POA: Diagnosis not present

## 2023-05-23 DIAGNOSIS — Z471 Aftercare following joint replacement surgery: Secondary | ICD-10-CM | POA: Diagnosis not present

## 2023-05-23 DIAGNOSIS — Z7901 Long term (current) use of anticoagulants: Secondary | ICD-10-CM | POA: Diagnosis not present

## 2023-05-23 DIAGNOSIS — Z79899 Other long term (current) drug therapy: Secondary | ICD-10-CM | POA: Diagnosis not present

## 2023-05-23 DIAGNOSIS — M797 Fibromyalgia: Secondary | ICD-10-CM | POA: Diagnosis not present

## 2023-05-23 DIAGNOSIS — G8918 Other acute postprocedural pain: Secondary | ICD-10-CM | POA: Diagnosis not present

## 2023-05-23 DIAGNOSIS — Z96642 Presence of left artificial hip joint: Secondary | ICD-10-CM | POA: Diagnosis not present

## 2023-05-23 DIAGNOSIS — I739 Peripheral vascular disease, unspecified: Secondary | ICD-10-CM | POA: Diagnosis not present

## 2023-05-23 DIAGNOSIS — Z7982 Long term (current) use of aspirin: Secondary | ICD-10-CM | POA: Diagnosis not present

## 2023-05-23 DIAGNOSIS — I7090 Unspecified atherosclerosis: Secondary | ICD-10-CM | POA: Diagnosis not present

## 2023-05-24 DIAGNOSIS — I739 Peripheral vascular disease, unspecified: Secondary | ICD-10-CM | POA: Diagnosis not present

## 2023-05-24 DIAGNOSIS — Z7901 Long term (current) use of anticoagulants: Secondary | ICD-10-CM | POA: Diagnosis not present

## 2023-05-24 DIAGNOSIS — I10 Essential (primary) hypertension: Secondary | ICD-10-CM | POA: Diagnosis not present

## 2023-05-24 DIAGNOSIS — M1612 Unilateral primary osteoarthritis, left hip: Secondary | ICD-10-CM | POA: Diagnosis not present

## 2023-05-24 DIAGNOSIS — I7090 Unspecified atherosclerosis: Secondary | ICD-10-CM | POA: Diagnosis not present

## 2023-05-24 DIAGNOSIS — Z7982 Long term (current) use of aspirin: Secondary | ICD-10-CM | POA: Diagnosis not present

## 2023-05-24 DIAGNOSIS — Z79899 Other long term (current) drug therapy: Secondary | ICD-10-CM | POA: Diagnosis not present

## 2023-05-24 DIAGNOSIS — M797 Fibromyalgia: Secondary | ICD-10-CM | POA: Diagnosis not present

## 2023-05-25 DIAGNOSIS — Z7409 Other reduced mobility: Secondary | ICD-10-CM | POA: Diagnosis not present

## 2023-05-25 DIAGNOSIS — R29898 Other symptoms and signs involving the musculoskeletal system: Secondary | ICD-10-CM | POA: Diagnosis not present

## 2023-05-25 DIAGNOSIS — M1612 Unilateral primary osteoarthritis, left hip: Secondary | ICD-10-CM | POA: Diagnosis not present

## 2023-05-25 DIAGNOSIS — M25652 Stiffness of left hip, not elsewhere classified: Secondary | ICD-10-CM | POA: Diagnosis not present

## 2023-05-29 DIAGNOSIS — M25652 Stiffness of left hip, not elsewhere classified: Secondary | ICD-10-CM | POA: Diagnosis not present

## 2023-05-29 DIAGNOSIS — Z7409 Other reduced mobility: Secondary | ICD-10-CM | POA: Diagnosis not present

## 2023-05-29 DIAGNOSIS — M1612 Unilateral primary osteoarthritis, left hip: Secondary | ICD-10-CM | POA: Diagnosis not present

## 2023-05-29 DIAGNOSIS — R29898 Other symptoms and signs involving the musculoskeletal system: Secondary | ICD-10-CM | POA: Diagnosis not present

## 2023-06-05 DIAGNOSIS — Z7409 Other reduced mobility: Secondary | ICD-10-CM | POA: Diagnosis not present

## 2023-06-05 DIAGNOSIS — M25652 Stiffness of left hip, not elsewhere classified: Secondary | ICD-10-CM | POA: Diagnosis not present

## 2023-06-05 DIAGNOSIS — M1612 Unilateral primary osteoarthritis, left hip: Secondary | ICD-10-CM | POA: Diagnosis not present

## 2023-06-05 DIAGNOSIS — R29898 Other symptoms and signs involving the musculoskeletal system: Secondary | ICD-10-CM | POA: Diagnosis not present

## 2023-06-06 DIAGNOSIS — Z96642 Presence of left artificial hip joint: Secondary | ICD-10-CM | POA: Diagnosis not present

## 2023-06-07 DIAGNOSIS — M25652 Stiffness of left hip, not elsewhere classified: Secondary | ICD-10-CM | POA: Diagnosis not present

## 2023-06-07 DIAGNOSIS — Z7409 Other reduced mobility: Secondary | ICD-10-CM | POA: Diagnosis not present

## 2023-06-07 DIAGNOSIS — R29898 Other symptoms and signs involving the musculoskeletal system: Secondary | ICD-10-CM | POA: Diagnosis not present

## 2023-06-07 DIAGNOSIS — M1612 Unilateral primary osteoarthritis, left hip: Secondary | ICD-10-CM | POA: Diagnosis not present

## 2023-06-12 DIAGNOSIS — Z7409 Other reduced mobility: Secondary | ICD-10-CM | POA: Diagnosis not present

## 2023-06-12 DIAGNOSIS — R29898 Other symptoms and signs involving the musculoskeletal system: Secondary | ICD-10-CM | POA: Diagnosis not present

## 2023-06-12 DIAGNOSIS — M1612 Unilateral primary osteoarthritis, left hip: Secondary | ICD-10-CM | POA: Diagnosis not present

## 2023-06-12 DIAGNOSIS — M25652 Stiffness of left hip, not elsewhere classified: Secondary | ICD-10-CM | POA: Diagnosis not present

## 2023-06-19 DIAGNOSIS — M25652 Stiffness of left hip, not elsewhere classified: Secondary | ICD-10-CM | POA: Diagnosis not present

## 2023-06-19 DIAGNOSIS — R29898 Other symptoms and signs involving the musculoskeletal system: Secondary | ICD-10-CM | POA: Diagnosis not present

## 2023-06-19 DIAGNOSIS — M1612 Unilateral primary osteoarthritis, left hip: Secondary | ICD-10-CM | POA: Diagnosis not present

## 2023-06-19 DIAGNOSIS — Z7409 Other reduced mobility: Secondary | ICD-10-CM | POA: Diagnosis not present

## 2023-06-24 DIAGNOSIS — H524 Presbyopia: Secondary | ICD-10-CM | POA: Diagnosis not present

## 2023-06-24 DIAGNOSIS — H35033 Hypertensive retinopathy, bilateral: Secondary | ICD-10-CM | POA: Diagnosis not present

## 2023-06-28 DIAGNOSIS — R29898 Other symptoms and signs involving the musculoskeletal system: Secondary | ICD-10-CM | POA: Diagnosis not present

## 2023-06-28 DIAGNOSIS — Z7409 Other reduced mobility: Secondary | ICD-10-CM | POA: Diagnosis not present

## 2023-06-28 DIAGNOSIS — M25652 Stiffness of left hip, not elsewhere classified: Secondary | ICD-10-CM | POA: Diagnosis not present

## 2023-06-28 DIAGNOSIS — M1612 Unilateral primary osteoarthritis, left hip: Secondary | ICD-10-CM | POA: Diagnosis not present

## 2023-06-30 DIAGNOSIS — B351 Tinea unguium: Secondary | ICD-10-CM | POA: Diagnosis not present

## 2023-07-03 DIAGNOSIS — Z7409 Other reduced mobility: Secondary | ICD-10-CM | POA: Diagnosis not present

## 2023-07-03 DIAGNOSIS — M25652 Stiffness of left hip, not elsewhere classified: Secondary | ICD-10-CM | POA: Diagnosis not present

## 2023-07-03 DIAGNOSIS — R29898 Other symptoms and signs involving the musculoskeletal system: Secondary | ICD-10-CM | POA: Diagnosis not present

## 2023-07-03 DIAGNOSIS — M1612 Unilateral primary osteoarthritis, left hip: Secondary | ICD-10-CM | POA: Diagnosis not present

## 2023-07-05 DIAGNOSIS — Z96642 Presence of left artificial hip joint: Secondary | ICD-10-CM | POA: Diagnosis not present

## 2023-07-05 DIAGNOSIS — R29898 Other symptoms and signs involving the musculoskeletal system: Secondary | ICD-10-CM | POA: Diagnosis not present

## 2023-07-05 DIAGNOSIS — M1612 Unilateral primary osteoarthritis, left hip: Secondary | ICD-10-CM | POA: Diagnosis not present

## 2023-07-05 DIAGNOSIS — Z7409 Other reduced mobility: Secondary | ICD-10-CM | POA: Diagnosis not present

## 2023-07-05 DIAGNOSIS — M25652 Stiffness of left hip, not elsewhere classified: Secondary | ICD-10-CM | POA: Diagnosis not present

## 2023-07-12 DIAGNOSIS — M25652 Stiffness of left hip, not elsewhere classified: Secondary | ICD-10-CM | POA: Diagnosis not present

## 2023-07-12 DIAGNOSIS — R29898 Other symptoms and signs involving the musculoskeletal system: Secondary | ICD-10-CM | POA: Diagnosis not present

## 2023-07-12 DIAGNOSIS — Z7409 Other reduced mobility: Secondary | ICD-10-CM | POA: Diagnosis not present

## 2023-07-12 DIAGNOSIS — M1612 Unilateral primary osteoarthritis, left hip: Secondary | ICD-10-CM | POA: Diagnosis not present

## 2023-07-13 DIAGNOSIS — N50819 Testicular pain, unspecified: Secondary | ICD-10-CM | POA: Diagnosis not present

## 2023-07-13 DIAGNOSIS — N529 Male erectile dysfunction, unspecified: Secondary | ICD-10-CM | POA: Diagnosis not present

## 2023-07-13 DIAGNOSIS — N401 Enlarged prostate with lower urinary tract symptoms: Secondary | ICD-10-CM | POA: Diagnosis not present

## 2023-07-17 DIAGNOSIS — R29898 Other symptoms and signs involving the musculoskeletal system: Secondary | ICD-10-CM | POA: Diagnosis not present

## 2023-07-17 DIAGNOSIS — M1612 Unilateral primary osteoarthritis, left hip: Secondary | ICD-10-CM | POA: Diagnosis not present

## 2023-07-17 DIAGNOSIS — Z7409 Other reduced mobility: Secondary | ICD-10-CM | POA: Diagnosis not present

## 2023-07-17 DIAGNOSIS — M25652 Stiffness of left hip, not elsewhere classified: Secondary | ICD-10-CM | POA: Diagnosis not present

## 2023-07-19 DIAGNOSIS — M1612 Unilateral primary osteoarthritis, left hip: Secondary | ICD-10-CM | POA: Diagnosis not present

## 2023-07-19 DIAGNOSIS — M25652 Stiffness of left hip, not elsewhere classified: Secondary | ICD-10-CM | POA: Diagnosis not present

## 2023-07-19 DIAGNOSIS — Z7409 Other reduced mobility: Secondary | ICD-10-CM | POA: Diagnosis not present

## 2023-07-19 DIAGNOSIS — R29898 Other symptoms and signs involving the musculoskeletal system: Secondary | ICD-10-CM | POA: Diagnosis not present

## 2023-07-29 DIAGNOSIS — N50812 Left testicular pain: Secondary | ICD-10-CM | POA: Diagnosis not present

## 2023-08-24 DIAGNOSIS — N529 Male erectile dysfunction, unspecified: Secondary | ICD-10-CM | POA: Diagnosis not present

## 2023-08-24 DIAGNOSIS — N50812 Left testicular pain: Secondary | ICD-10-CM | POA: Diagnosis not present

## 2023-08-24 DIAGNOSIS — Z96642 Presence of left artificial hip joint: Secondary | ICD-10-CM | POA: Diagnosis not present

## 2023-09-05 DIAGNOSIS — M21621 Bunionette of right foot: Secondary | ICD-10-CM | POA: Diagnosis not present

## 2023-09-06 DIAGNOSIS — M62838 Other muscle spasm: Secondary | ICD-10-CM | POA: Diagnosis not present

## 2023-09-06 DIAGNOSIS — Z13220 Encounter for screening for lipoid disorders: Secondary | ICD-10-CM | POA: Diagnosis not present

## 2023-11-08 DIAGNOSIS — N529 Male erectile dysfunction, unspecified: Secondary | ICD-10-CM | POA: Diagnosis not present

## 2023-11-08 DIAGNOSIS — N50819 Testicular pain, unspecified: Secondary | ICD-10-CM | POA: Diagnosis not present

## 2023-11-09 DIAGNOSIS — N529 Male erectile dysfunction, unspecified: Secondary | ICD-10-CM | POA: Diagnosis not present

## 2023-11-23 DIAGNOSIS — R252 Cramp and spasm: Secondary | ICD-10-CM | POA: Diagnosis not present

## 2023-11-23 DIAGNOSIS — R7989 Other specified abnormal findings of blood chemistry: Secondary | ICD-10-CM | POA: Diagnosis not present

## 2023-12-20 DIAGNOSIS — E291 Testicular hypofunction: Secondary | ICD-10-CM | POA: Diagnosis not present

## 2023-12-20 DIAGNOSIS — N529 Male erectile dysfunction, unspecified: Secondary | ICD-10-CM | POA: Diagnosis not present

## 2023-12-20 DIAGNOSIS — N401 Enlarged prostate with lower urinary tract symptoms: Secondary | ICD-10-CM | POA: Diagnosis not present

## 2023-12-28 DIAGNOSIS — H269 Unspecified cataract: Secondary | ICD-10-CM | POA: Diagnosis not present

## 2023-12-28 DIAGNOSIS — Z7901 Long term (current) use of anticoagulants: Secondary | ICD-10-CM | POA: Diagnosis not present

## 2023-12-28 DIAGNOSIS — N529 Male erectile dysfunction, unspecified: Secondary | ICD-10-CM | POA: Diagnosis not present

## 2023-12-28 DIAGNOSIS — E785 Hyperlipidemia, unspecified: Secondary | ICD-10-CM | POA: Diagnosis not present

## 2023-12-28 DIAGNOSIS — E669 Obesity, unspecified: Secondary | ICD-10-CM | POA: Diagnosis not present

## 2023-12-28 DIAGNOSIS — R7303 Prediabetes: Secondary | ICD-10-CM | POA: Diagnosis not present

## 2023-12-28 DIAGNOSIS — I739 Peripheral vascular disease, unspecified: Secondary | ICD-10-CM | POA: Diagnosis not present

## 2023-12-28 DIAGNOSIS — N400 Enlarged prostate without lower urinary tract symptoms: Secondary | ICD-10-CM | POA: Diagnosis not present

## 2023-12-28 DIAGNOSIS — G629 Polyneuropathy, unspecified: Secondary | ICD-10-CM | POA: Diagnosis not present

## 2023-12-28 DIAGNOSIS — I10 Essential (primary) hypertension: Secondary | ICD-10-CM | POA: Diagnosis not present

## 2024-02-21 DIAGNOSIS — L649 Androgenic alopecia, unspecified: Secondary | ICD-10-CM | POA: Diagnosis not present

## 2024-02-29 DIAGNOSIS — R748 Abnormal levels of other serum enzymes: Secondary | ICD-10-CM | POA: Diagnosis not present

## 2024-02-29 DIAGNOSIS — R7303 Prediabetes: Secondary | ICD-10-CM | POA: Diagnosis not present

## 2024-02-29 DIAGNOSIS — Z23 Encounter for immunization: Secondary | ICD-10-CM | POA: Diagnosis not present
